# Patient Record
Sex: Female | Born: 1990
Health system: Southern US, Community
[De-identification: ages and names within clinical notes are randomized; demographics above are authoritative.]

## PROBLEM LIST (undated history)

## (undated) DIAGNOSIS — G43909 Migraine, unspecified, not intractable, without status migrainosus: Secondary | ICD-10-CM

## (undated) DIAGNOSIS — Z87448 Personal history of other diseases of urinary system: Secondary | ICD-10-CM

## (undated) HISTORY — DX: Migraine, unspecified, not intractable, without status migrainosus: G43.909

## (undated) HISTORY — PX: UMBILICAL HERNIA REPAIR: SHX196

## (undated) HISTORY — DX: Personal history of other diseases of urinary system: Z87.448

---
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
## 2015-06-03 ENCOUNTER — Other Ambulatory Visit (HOSPITAL_COMMUNITY)
Admission: RE | Admit: 2015-06-03 | Discharge: 2015-06-03 | Disposition: A | Discharge status: Home / Self Care | Payer: BLUE CROSS/BLUE SHIELD | Source: Ambulatory Visit | Attending: Obstetrics and Gynecology | Admitting: Obstetrics and Gynecology

## 2015-06-03 ENCOUNTER — Other Ambulatory Visit: Payer: Self-pay | Admitting: Obstetrics and Gynecology

## 2015-06-03 DIAGNOSIS — Z01419 Encounter for gynecological examination (general) (routine) without abnormal findings: Secondary | ICD-10-CM | POA: Diagnosis present

## 2015-06-03 DIAGNOSIS — Z113 Encounter for screening for infections with a predominantly sexual mode of transmission: Secondary | ICD-10-CM | POA: Insufficient documentation

## 2015-06-07 LAB — CYTOLOGY - PAP

## 2015-09-20 DIAGNOSIS — N3944 Nocturnal enuresis: Secondary | ICD-10-CM | POA: Diagnosis not present

## 2015-09-20 DIAGNOSIS — R350 Frequency of micturition: Secondary | ICD-10-CM | POA: Diagnosis not present

## 2015-09-20 DIAGNOSIS — N3946 Mixed incontinence: Secondary | ICD-10-CM | POA: Diagnosis not present

## 2015-11-05 DIAGNOSIS — M25461 Effusion, right knee: Secondary | ICD-10-CM | POA: Diagnosis not present

## 2015-11-16 DIAGNOSIS — M25561 Pain in right knee: Secondary | ICD-10-CM | POA: Diagnosis not present

## 2015-11-16 DIAGNOSIS — M25461 Effusion, right knee: Secondary | ICD-10-CM | POA: Diagnosis not present

## 2016-01-19 DIAGNOSIS — M25561 Pain in right knee: Secondary | ICD-10-CM | POA: Diagnosis not present

## 2016-01-24 DIAGNOSIS — M25561 Pain in right knee: Secondary | ICD-10-CM | POA: Diagnosis not present

## 2016-05-29 DIAGNOSIS — L309 Dermatitis, unspecified: Secondary | ICD-10-CM | POA: Diagnosis not present

## 2016-06-08 DIAGNOSIS — N3289 Other specified disorders of bladder: Secondary | ICD-10-CM | POA: Diagnosis not present

## 2016-06-08 DIAGNOSIS — F411 Generalized anxiety disorder: Secondary | ICD-10-CM | POA: Diagnosis not present

## 2016-06-08 DIAGNOSIS — F321 Major depressive disorder, single episode, moderate: Secondary | ICD-10-CM | POA: Diagnosis not present

## 2016-06-08 DIAGNOSIS — J4520 Mild intermittent asthma, uncomplicated: Secondary | ICD-10-CM | POA: Diagnosis not present

## 2016-06-28 DIAGNOSIS — F321 Major depressive disorder, single episode, moderate: Secondary | ICD-10-CM | POA: Diagnosis not present

## 2016-06-28 DIAGNOSIS — F411 Generalized anxiety disorder: Secondary | ICD-10-CM | POA: Diagnosis not present

## 2016-07-14 DIAGNOSIS — F321 Major depressive disorder, single episode, moderate: Secondary | ICD-10-CM | POA: Diagnosis not present

## 2016-07-14 DIAGNOSIS — R5383 Other fatigue: Secondary | ICD-10-CM | POA: Diagnosis not present

## 2016-07-14 DIAGNOSIS — F410 Panic disorder [episodic paroxysmal anxiety] without agoraphobia: Secondary | ICD-10-CM | POA: Diagnosis not present

## 2016-07-14 DIAGNOSIS — F411 Generalized anxiety disorder: Secondary | ICD-10-CM | POA: Diagnosis not present

## 2016-10-20 DIAGNOSIS — H16002 Unspecified corneal ulcer, left eye: Secondary | ICD-10-CM | POA: Diagnosis not present

## 2016-10-23 DIAGNOSIS — H16002 Unspecified corneal ulcer, left eye: Secondary | ICD-10-CM | POA: Diagnosis not present

## 2017-05-23 DIAGNOSIS — R350 Frequency of micturition: Secondary | ICD-10-CM | POA: Diagnosis not present

## 2017-05-23 DIAGNOSIS — N3946 Mixed incontinence: Secondary | ICD-10-CM | POA: Diagnosis not present

## 2017-05-25 ENCOUNTER — Encounter (INDEPENDENT_AMBULATORY_CARE_PROVIDER_SITE_OTHER): Payer: Self-pay | Admitting: Orthopaedic Surgery

## 2017-05-25 ENCOUNTER — Ambulatory Visit (INDEPENDENT_AMBULATORY_CARE_PROVIDER_SITE_OTHER): Payer: BLUE CROSS/BLUE SHIELD

## 2017-05-25 ENCOUNTER — Ambulatory Visit (INDEPENDENT_AMBULATORY_CARE_PROVIDER_SITE_OTHER): Payer: BLUE CROSS/BLUE SHIELD | Admitting: Orthopaedic Surgery

## 2017-05-25 VITALS — BP 145/86 | HR 77 | Ht 73.0 in | Wt 260.0 lb

## 2017-05-25 DIAGNOSIS — G8929 Other chronic pain: Secondary | ICD-10-CM | POA: Diagnosis not present

## 2017-05-25 DIAGNOSIS — M2550 Pain in unspecified joint: Secondary | ICD-10-CM | POA: Diagnosis not present

## 2017-05-25 DIAGNOSIS — M545 Low back pain: Secondary | ICD-10-CM

## 2017-05-25 MED ORDER — METHYLPREDNISOLONE 4 MG PO TABS: ORAL_TABLET | ORAL | 0 refills | Status: DC

## 2017-05-25 NOTE — Progress Notes (Signed)
Office Visit Note   Patient: Ashley Valencia           Date of Birth: [birth year]           MRN: [id number] Visit Date: 05/25/2017              Requested by: No referring provider defined for this encounter. PCP: Patient, No Pcp Per   Assessment & Plan: Visit Diagnoses:  1. Chronic bilateral low back pain, with sciatica presence unspecified   2. Polyarthralgia     Plan: With patient's ongoing chronic back problem I will schedule lumbar MRI.  She has failed conservative treatment with prednisone taper, over-the-counter NSAIDs, home exercise program, activity modification.  With patient's ongoing back problem and also bilateral knee pain I had blood work drawn today to check CBC and arthritis panel.  Patient will follow up with Dr. Lorin Mercy in 3 weeks for recheck and to review MRI and blood work.  We will possibly get x-rays bilateral knees at next office visit.  Follow-Up Instructions: Return in about 3 weeks (around 06/15/2017) for With Dr. Lorin Mercy to review lumbar MRI and review labs.   Orders:  Orders Placed This Encounter  Procedures  . XR Lumbar Spine 2-3 Views  . MR Lumbar Spine w/o contrast  . CBC  . Uric acid  . Rheumatoid Factor  . Sed Rate (ESR)  . Antinuclear Antib (ANA)  . HLA-B27 antigen   Meds ordered this encounter  Medications  . methylPREDNISolone (MEDROL) 4 MG tablet    Sig: 6-day taper to be taken as directed    Dispense:  21 tablet    Refill:  0      Procedures: No procedures performed   Clinical Data: No additional findings.   Subjective: Chief Complaint  Patient presents with  . Lower Back - Pain    HPI 27 year old female who is new patient the office comes in with complaints of low back pain and bilateral knee pain.  States that she has had chronic issues with her back for at least 3 years and this has been progressively getting worse the last 6 months.  No specific injury that she can recall.  She was previously seen by Dr. Rip Harbour with  [facility] about 1-2 years ago and was given a prednisone taper and muscle relaxer.  She does not recall any improvement at that time.  She was also given home exercise program that she has done without any relief.  She describes having worsening central low back pain that extends to the left.  No lower extremity radiculopathy.  No complaint of bowel or bladder incontinence.  Her pain is aggravated with laying down, bending, twisting, walking.  Patient helps to take care of her mother who has had an amputation so she is required to do a lot of lifting for transfers.  He also works at [employer] 40 hours/week and does admit to missing some days due to her pain being very bad.  There was some delay in being evaluated due to some insurance issues. Review of Systems No current cardiac pulmonary GI GU issues  Objective: Vital Signs: BP (!) 145/86   Pulse 77   Ht _0  (1.854 m)   Wt 260 lb (117.9 kg)   BMI 34.30 kg/m   Physical Exam  Constitutional: She is oriented to person, place, and time. She appears well-developed. No distress.  HENT:  Head: Normocephalic.  Eyes: EOM are normal.  Pulmonary/Chest: No respiratory distress.  Musculoskeletal:  Exam very pleasant female alert and oriented in no acute distress.  She does have some central low back pain with lumbar extension.  Lumbar flexion hands to ankles without any issues.  She is tender over the L4 and L5 spinous processes.  Nontender over the SI joints.  Mild to moderate left lower lumbar paraspinal tenderness.  Can heel and toe gait without difficulty.  Negative logroll bilateral hips.  Negative straight leg raise.  Bilateral knees good range of motion.  Bilateral knees nontender today.  Cruciate and collateral ligaments are stable.  Negative patellar apprehension.  Not much by the way of patellofemoral crepitus.  Bilateral calves nontender.  Neurovascularly intact.  No focal motor deficits.  Neurological: She is alert and oriented to  person, place, and time.    Ortho Exam  Specialty Comments:  No specialty comments available.  Imaging: Xr Lumbar Spine 2-3 Views  Result Date: 05/25/2017 X-rays lumbar spine does show some straightening of the normal lumbar lordosis.  No acute finding.    PMFS History: There are no active problems to display for this patient.  Past Medical History:  Diagnosis Date  . History of bladder problems   . Migraines     Family History  Problem Relation Age of Onset  . Diabetes Mother   . Hypertension Mother   . Kidney failure Mother   . Diabetes Maternal Grandmother   . Hypertension Maternal Grandmother   . Kidney failure Maternal Grandmother   . Diabetes Maternal Grandfather   . Hypertension Maternal Grandfather     Past Surgical History:  Procedure Laterality Date  . UMBILICAL HERNIA REPAIR     age 47   Social History   Occupational History  . Not on file  Tobacco Use  . Smoking status: Never Smoker  . Smokeless tobacco: Never Used  Substance and Sexual Activity  . Alcohol use: Yes    Comment: rarely  . Drug use: No  . Sexual activity: Not on file

## 2017-05-28 LAB — CBC
HEMATOCRIT: 31.6 % — AB (ref 35.0–45.0)
Hemoglobin: 9.6 g/dL — ABNORMAL LOW (ref 11.7–15.5)
MCH: 20.4 pg — ABNORMAL LOW (ref 27.0–33.0)
MCHC: 30.4 g/dL — ABNORMAL LOW (ref 32.0–36.0)
MCV: 67.1 fL — AB (ref 80.0–100.0)
PLATELETS: 278 10*3/uL (ref 140–400)
RBC: 4.71 10*6/uL (ref 3.80–5.10)
RDW: 14.8 % (ref 11.0–15.0)
WBC: 6.4 10*3/uL (ref 3.8–10.8)

## 2017-05-28 LAB — URIC ACID: Uric Acid, Serum: 4.7 mg/dL (ref 2.5–7.0)

## 2017-05-28 LAB — ANA: ANA: NEGATIVE

## 2017-05-28 LAB — RHEUMATOID FACTOR: Rhuematoid fact SerPl-aCnc: <14 IU/mL (ref <14)

## 2017-05-28 LAB — HLA-B27 ANTIGEN: HLA-B27 Antigen: NEGATIVE

## 2017-05-28 LAB — SEDIMENTATION RATE: Sed Rate: 11 mm/h (ref 0–20)

## 2017-06-05 DIAGNOSIS — F411 Generalized anxiety disorder: Secondary | ICD-10-CM | POA: Diagnosis not present

## 2017-06-05 DIAGNOSIS — Z0000 Encounter for general adult medical examination without abnormal findings: Secondary | ICD-10-CM | POA: Diagnosis not present

## 2017-06-05 DIAGNOSIS — D509 Iron deficiency anemia, unspecified: Secondary | ICD-10-CM | POA: Diagnosis not present

## 2017-06-05 DIAGNOSIS — F331 Major depressive disorder, recurrent, moderate: Secondary | ICD-10-CM | POA: Diagnosis not present

## 2017-06-05 DIAGNOSIS — K219 Gastro-esophageal reflux disease without esophagitis: Secondary | ICD-10-CM | POA: Diagnosis not present

## 2017-06-05 DIAGNOSIS — Z23 Encounter for immunization: Secondary | ICD-10-CM | POA: Diagnosis not present

## 2017-06-12 ENCOUNTER — Ambulatory Visit
Admission: RE | Admit: 2017-06-12 | Discharge: 2017-06-12 | Disposition: A | Discharge status: Home / Self Care | Payer: BLUE CROSS/BLUE SHIELD | Source: Ambulatory Visit | Attending: Surgery | Admitting: Surgery

## 2017-06-12 DIAGNOSIS — M5127 Other intervertebral disc displacement, lumbosacral region: Secondary | ICD-10-CM | POA: Diagnosis not present

## 2017-06-12 DIAGNOSIS — M545 Low back pain: Principal | ICD-10-CM

## 2017-06-12 DIAGNOSIS — G8929 Other chronic pain: Secondary | ICD-10-CM

## 2017-07-03 ENCOUNTER — Encounter (INDEPENDENT_AMBULATORY_CARE_PROVIDER_SITE_OTHER): Payer: Self-pay | Admitting: Orthopaedic Surgery

## 2017-07-03 ENCOUNTER — Ambulatory Visit (INDEPENDENT_AMBULATORY_CARE_PROVIDER_SITE_OTHER): Payer: BLUE CROSS/BLUE SHIELD | Admitting: Orthopaedic Surgery

## 2017-07-03 VITALS — BP 148/84 | HR 73 | Ht 73.0 in | Wt 259.0 lb

## 2017-07-03 DIAGNOSIS — M47816 Spondylosis without myelopathy or radiculopathy, lumbar region: Secondary | ICD-10-CM

## 2017-07-03 NOTE — Progress Notes (Signed)
Office Visit Note   Patient: Ashley Valencia           Date of Birth: 03/31/1990           MRN: [id number] Visit Date: 07/03/2017              Requested by: No referring provider defined for this encounter. PCP: Patient, No Pcp Per   Assessment & Plan: Visit Diagnoses:  1. Lumbar facet arthropathy     Plan: We reviewed the MRI scan I gave her a copy of the report.  She has lumbar facet arthropathy most significant at L4-5.  She has anemia is being further investigated currently she is taking iron.  We discussed the facet injection if her symptoms got so severe she could not work.  She needs to work on weight loss, core strengthening to help support her lumbar spine and degenerative facets.  She has several levels with facet degeneration in the lumbar spine which makes this more difficult problem at her young age of 27.  She may need to look for different work activity that does not bother her back as much in the future.  We discussed weight loss to help unload her back and core strengthening to help.  She can use Tylenol twice a day.  I am hesitant to recommend anti-inflammatories since she has significant anemia.  I plan to recheck her in 3 months.  We will weigh her on return.  Follow-Up Instructions: Return in about 3 months (around 10/02/2017).   Orders:  No orders of the defined types were placed in this encounter.  No orders of the defined types were placed in this encounter.     Procedures: No procedures performed   Clinical Data: No additional findings.   Subjective: Chief Complaint  Patient presents with  . Lower Back - Follow-up    MRI review L spine  . Results    HPI returns with ongoing back symptoms.  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  Rheumatologic lab work was normal.  CBC showed significant anemia with hemoglobin of 9.6.  MCV was decreased as well as MCH and MCHC.  She was placed  on iron by her primary care.  She is here for review of her lumbar MRI scan.  Patient had anti-inflammatories prednisone pack in the past.  Review of Systems 14 point review of systems updated unchanged from 05/25/2017 other than as mentioned in HPI.   Objective: Vital Signs: BP (!) 148/84   Pulse 73   Ht 6\' 1"  (1.854 m)   Wt 259 lb (117.5 kg)   BMI 34.17 kg/m   Physical Exam  Constitutional: She is oriented to person, place, and time. She appears well-developed.  HENT:  Head: Normocephalic.  Right Ear: External ear normal.  Left Ear: External ear normal.  Eyes: Pupils are equal, round, and reactive to light.  Neck: No tracheal deviation present. No thyromegaly present.  Cardiovascular: Normal rate.  Pulmonary/Chest: Effort normal.  Abdominal: Soft.  Neurological: She is alert and oriented to person, place, and time.  Skin: Skin is warm and dry.  Psychiatric: She has a normal mood and affect. Her behavior is normal.    Ortho Exam patient has discomfort with lumbar extension and flexion as well as rotation.  She is able heel and toe walk.  No isolated motor weakness.  Sensation lower extremities is intact no rash over exposed skin.  Knee and hip range of motion is normal.  Specialty Comments:  No specialty comments available.  Imaging: CLINICAL DATA:  Low back pain over the last 5 years worsening recently.  EXAM: MRI LUMBAR SPINE WITHOUT CONTRAST  TECHNIQUE: Multiplanar, multisequence MR imaging of the lumbar spine was performed. No intravenous contrast was administered.  COMPARISON:  None.  FINDINGS: Segmentation:  5 lumbar type vertebral bodies.  Alignment:  Straightening of the normal lumbar lordosis.  Vertebrae: No primary bone lesion. Multiple endplate Schmorl's nodes but without edema.  Conus medullaris and cauda equina: Conus extends to the L1 level. Conus and cauda equina appear normal.  Paraspinal and other soft tissues: Negative  Disc  levels:  No significant finding at L2-3 or above. No bulge or herniation. No stenosis of the canal or foramina.  L3-4: Mild desiccation and bulging of the disc. No stenosis of the canal or foramina.  L4-5: Mild desiccation and bulging of the disc. No stenosis of the canal or foramina. The patient does show facet osteoarthritis at this level with small effusions and mild edema. This could be a cause of low back or referred facet syndrome pain.  L5-S1: Normal appearance of the disc. Mild facet degeneration and hypertrophy. No stenosis of the canal or foramina. Facet arthritis could contribute to low back pain.  IMPRESSION: Mild non-compressive disc bulges at L3-4 and L4-5. There are multiple endplate Schmorl's nodes throughout the lumbar region. These do not show edematous change to suggest that they would be symptomatic  Facet arthritis worse at L4-5 than at L5-S1. Particularly at L4-5 this could be a cause of back pain or referred facet syndrome pain.   Electronically Signed   By: Paulina Fusi M.D.   On: 06/12/2017 16:17    PMFS History: There are no active problems to display for this patient.  Past Medical History:  Diagnosis Date  . History of bladder problems   . Migraines     Family History  Problem Relation Age of Onset  . Diabetes Mother   . Hypertension Mother   . Kidney failure Mother   . Diabetes Maternal Grandmother   . Hypertension Maternal Grandmother   . Kidney failure Maternal Grandmother   . Diabetes Maternal Grandfather   . Hypertension Maternal Grandfather     Past Surgical History:  Procedure Laterality Date  . UMBILICAL HERNIA REPAIR     age 46   Social History   Occupational History  . Not on file  Tobacco Use  . Smoking status: Never Smoker  . Smokeless tobacco: Never Used  Substance and Sexual Activity  . Alcohol use: Yes    Comment: rarely  . Drug use: No  . Sexual activity: Not on file

## 2017-07-31 DIAGNOSIS — M79671 Pain in right foot: Secondary | ICD-10-CM | POA: Diagnosis not present

## 2017-07-31 DIAGNOSIS — D509 Iron deficiency anemia, unspecified: Secondary | ICD-10-CM | POA: Diagnosis not present

## 2017-07-31 DIAGNOSIS — G43101 Migraine with aura, not intractable, with status migrainosus: Secondary | ICD-10-CM | POA: Diagnosis not present

## 2017-09-03 DIAGNOSIS — K219 Gastro-esophageal reflux disease without esophagitis: Secondary | ICD-10-CM | POA: Diagnosis not present

## 2017-09-03 DIAGNOSIS — G43101 Migraine with aura, not intractable, with status migrainosus: Secondary | ICD-10-CM | POA: Diagnosis not present

## 2017-09-03 DIAGNOSIS — D509 Iron deficiency anemia, unspecified: Secondary | ICD-10-CM | POA: Diagnosis not present

## 2017-10-02 ENCOUNTER — Ambulatory Visit (INDEPENDENT_AMBULATORY_CARE_PROVIDER_SITE_OTHER): Payer: BLUE CROSS/BLUE SHIELD | Admitting: Orthopaedic Surgery

## 2018-07-04 DIAGNOSIS — G43101 Migraine with aura, not intractable, with status migrainosus: Secondary | ICD-10-CM | POA: Diagnosis not present

## 2018-07-04 DIAGNOSIS — F411 Generalized anxiety disorder: Secondary | ICD-10-CM | POA: Diagnosis not present

## 2018-07-04 DIAGNOSIS — F331 Major depressive disorder, recurrent, moderate: Secondary | ICD-10-CM | POA: Diagnosis not present

## 2018-07-04 DIAGNOSIS — D509 Iron deficiency anemia, unspecified: Secondary | ICD-10-CM | POA: Diagnosis not present

## 2018-07-04 DIAGNOSIS — N3946 Mixed incontinence: Secondary | ICD-10-CM | POA: Diagnosis not present

## 2018-07-04 DIAGNOSIS — R350 Frequency of micturition: Secondary | ICD-10-CM | POA: Diagnosis not present

## 2018-07-24 ENCOUNTER — Other Ambulatory Visit: Payer: Self-pay

## 2018-07-24 ENCOUNTER — Encounter: Payer: Self-pay | Admitting: Orthopaedic Surgery

## 2018-07-24 ENCOUNTER — Ambulatory Visit: Payer: BLUE CROSS/BLUE SHIELD | Admitting: Orthopaedic Surgery

## 2018-07-24 VITALS — Ht 73.0 in | Wt 245.0 lb

## 2018-07-24 DIAGNOSIS — M47816 Spondylosis without myelopathy or radiculopathy, lumbar region: Secondary | ICD-10-CM | POA: Diagnosis not present

## 2018-07-24 NOTE — Progress Notes (Signed)
Office Visit Note   Patient: Ashley Valencia           Date of Birth: 05/06/1990           MRN: [id number] Visit Date: 07/24/2018              Requested by: No referring provider defined for this encounter. PCP: Patient, No Pcp Per   Assessment & Plan: Visit Diagnoses:  1. Lumbar facet arthropathy     Plan: Patient has maintained disc space height but has minimal disc bulge and significant facet arthropathy in the lumbar spine particularly for her age.  We will have her see Dr. Alvester MorinNewton he can consider epidural injection or facet injection to see if she can get some relief.  Should continue to work on core strengthening getting back in the gym as the gyms reopen after COVID closure.  She will continue to work on weight loss as she is doing.  I plan to recheck her in 8 weeks.  Follow-Up Instructions: No follow-ups on file.   Orders:  Orders Placed This Encounter  Procedures  . Ambulatory referral to Physical Medicine Rehab   No orders of the defined types were placed in this encounter.     Procedures: No procedures performed   Clinical Data: No additional findings.   Subjective: Chief Complaint  Patient presents with  . Lower Back - Pain    HPI 28 year old female returns with ongoing problems with low back pain.  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  She has not gained any weight.  Previous scan showed facet arthropathy at L3-4, L4-5 and L5-S1.  She did have some disc bulge at L3-4 and L4-5 centrally.  She has had bilateral pain in her hips pain sometimes is gone down her right leg but stops at her knee.  She denies numbness or tingling in her feet.  She is taken over-the-counter medication used a heating pad without relief.  Patient is not been on any anti-inflammatories due to past problems with significant anemia.  We had discussed core strengthening and weight loss and she is congratulated in that she has lost 14 pounds.  Her pain is sometimes worse  with sitting.  No fever chills no associated bowel or bladder symptoms.  Review of Systems updated unchanged from 05/16/18 other than as mentioned in HPI   Objective: Vital Signs: Ht 6\' 1"  (1.854 m)   Wt 245 lb (111.1 kg)   BMI 32.32 kg/m   Physical Exam Constitutional:      Appearance: She is well-developed.  HENT:     Head: Normocephalic.     Right Ear: External ear normal.     Left Ear: External ear normal.  Eyes:     Pupils: Pupils are equal, round, and reactive to light.  Neck:     Thyroid: No thyromegaly.     Trachea: No tracheal deviation.  Cardiovascular:     Rate and Rhythm: Normal rate.  Pulmonary:     Effort: Pulmonary effort is normal.  Abdominal:     Palpations: Abdomen is soft.  Skin:    General: Skin is warm and dry.  Neurological:     Mental Status: She is alert and oriented to person, place, and time.  Psychiatric:        Behavior: Behavior normal.     Ortho Exam patient has negative straight leg raising right and left.  No rash over exposed skin negative logroll to the  hips.  She gets from sitting standing can walk on her heels and also on her toes with no weakness.  Mild discomfort in sciatic notch right and left.  She still has discomfort with lumbar extension and also with flexion.  Specialty Comments:  No specialty comments available.  Imaging: CLINICAL DATA:  Low back pain over the last 5 years worsening recently.  EXAM: MRI LUMBAR SPINE WITHOUT CONTRAST  TECHNIQUE: Multiplanar, multisequence MR imaging of the lumbar spine was performed. No intravenous contrast was administered.  COMPARISON:  None.  FINDINGS: Segmentation:  5 lumbar type vertebral bodies.  Alignment:  Straightening of the normal lumbar lordosis.  Vertebrae: No primary bone lesion. Multiple endplate Schmorl's nodes but without edema.  Conus medullaris and cauda equina: Conus extends to the L1 level. Conus and cauda equina appear normal.  Paraspinal and  other soft tissues: Negative  Disc levels:  No significant finding at L2-3 or above. No bulge or herniation. No stenosis of the canal or foramina.  L3-4: Mild desiccation and bulging of the disc. No stenosis of the canal or foramina.  L4-5: Mild desiccation and bulging of the disc. No stenosis of the canal or foramina. The patient does show facet osteoarthritis at this level with small effusions and mild edema. This could be a cause of low back or referred facet syndrome pain.  L5-S1: Normal appearance of the disc. Mild facet degeneration and hypertrophy. No stenosis of the canal or foramina. Facet arthritis could contribute to low back pain.  IMPRESSION: Mild non-compressive disc bulges at L3-4 and L4-5. There are multiple endplate Schmorl's nodes throughout the lumbar region. These do not show edematous change to suggest that they would be symptomatic  Facet arthritis worse at L4-5 than at L5-S1. Particularly at L4-5 this could be a cause of back pain or referred facet syndrome pain.   Electronically Signed   By: Paulina Fusi M.D.   On: 06/12/2017 16:17   PMFS History: Patient Active Problem List   Diagnosis Date Noted  . Lumbar facet arthropathy 07/25/2018   Past Medical History:  Diagnosis Date  . History of bladder problems   . Migraines     Family History  Problem Relation Age of Onset  . Diabetes Mother   . Hypertension Mother   . Kidney failure Mother   . Diabetes Maternal Grandmother   . Hypertension Maternal Grandmother   . Kidney failure Maternal Grandmother   . Diabetes Maternal Grandfather   . Hypertension Maternal Grandfather     Past Surgical History:  Procedure Laterality Date  . UMBILICAL HERNIA REPAIR     age 4   Social History   Occupational History  . Not on file  Tobacco Use  . Smoking status: Never Smoker  . Smokeless tobacco: Never Used  Substance and Sexual Activity  . Alcohol use: Yes    Comment: rarely  . Drug  use: No  . Sexual activity: Not on file

## 2018-07-25 ENCOUNTER — Encounter: Payer: Self-pay | Admitting: Orthopaedic Surgery

## 2018-07-25 DIAGNOSIS — M47816 Spondylosis without myelopathy or radiculopathy, lumbar region: Secondary | ICD-10-CM | POA: Insufficient documentation

## 2018-07-25 NOTE — Progress Notes (Signed)
Pt states a 7/10 for pain.

## 2018-07-29 ENCOUNTER — Telehealth: Payer: Self-pay | Admitting: Radiology

## 2018-07-29 NOTE — Telephone Encounter (Signed)
Received denial from insurance for Lumbar Facet Injection due to note regarding patient's conservative treatment being over 6 months old. Would you like to addend last office note regarding what she has tried so that we can resubmit?

## 2018-07-29 NOTE — Telephone Encounter (Signed)
No much I can do if insurance requires recent conservative treatment before they will approve a facet injection.  Call patient schedule her for some physical therapy and I will recheck her in 1 month and then we can resubmit.  Thank you

## 2018-07-31 NOTE — Telephone Encounter (Signed)
I left voicemail for patient requesting return call to let me know where she would like to go to PT.  I explained the injection had been denied due to no new documentation of PT.  Also left [facility] number in the instance patient would like to reach me there.

## 2018-08-08 ENCOUNTER — Telehealth: Payer: Self-pay | Admitting: Orthopaedic Surgery

## 2018-08-08 MED ORDER — TRAMADOL HCL 50 MG PO TABS: 50.0000 mg | ORAL_TABLET | Freq: Two times a day (BID) | ORAL | 0 refills | Status: DC | PRN

## 2018-08-08 NOTE — Telephone Encounter (Signed)
[facility] called,stated they received rx for Tramadol. Wanted you to be aware patient is also taking amitriptyline and can have interactions. Please callback [phone]

## 2018-08-08 NOTE — Telephone Encounter (Signed)
Ok for ultram 50 mg # 30 1 po bid prn pain thanks [facility]

## 2018-08-08 NOTE — Telephone Encounter (Signed)
Patient has done something to back and is in a lot of pain. Is it possible to get something for pain at least until appt Tuesday? [facility]/[employer]. pts callback [phone]

## 2018-08-08 NOTE — Telephone Encounter (Signed)
Please advise 

## 2018-08-08 NOTE — Telephone Encounter (Signed)
I called med to pharmacy.  I left voicemail for patient advising.  I also explained that I had left voicemail for return call last week in regards to injection being denied by insurance and patient requiring physical therapy prior to insurance approving. I still have not heard from patient regarding PT.

## 2018-08-09 NOTE — Telephone Encounter (Signed)
pls advise. thanks 

## 2018-08-13 ENCOUNTER — Ambulatory Visit: Payer: BLUE CROSS/BLUE SHIELD | Admitting: Orthopaedic Surgery

## 2018-08-13 NOTE — Telephone Encounter (Signed)
I called no answer. Has 9 AM appt . Today.

## 2019-01-28 DIAGNOSIS — K219 Gastro-esophageal reflux disease without esophagitis: Secondary | ICD-10-CM | POA: Diagnosis not present

## 2019-01-28 DIAGNOSIS — G43101 Migraine with aura, not intractable, with status migrainosus: Secondary | ICD-10-CM | POA: Diagnosis not present

## 2019-01-28 DIAGNOSIS — D509 Iron deficiency anemia, unspecified: Secondary | ICD-10-CM | POA: Diagnosis not present

## 2019-01-28 DIAGNOSIS — Z0000 Encounter for general adult medical examination without abnormal findings: Secondary | ICD-10-CM | POA: Diagnosis not present

## 2019-01-28 DIAGNOSIS — F411 Generalized anxiety disorder: Secondary | ICD-10-CM | POA: Diagnosis not present

## 2019-01-28 DIAGNOSIS — Z13220 Encounter for screening for lipoid disorders: Secondary | ICD-10-CM | POA: Diagnosis not present

## 2019-01-28 DIAGNOSIS — F332 Major depressive disorder, recurrent severe without psychotic features: Secondary | ICD-10-CM | POA: Diagnosis not present

## 2019-02-18 DIAGNOSIS — N898 Other specified noninflammatory disorders of vagina: Secondary | ICD-10-CM | POA: Diagnosis not present

## 2019-02-18 DIAGNOSIS — Z01419 Encounter for gynecological examination (general) (routine) without abnormal findings: Secondary | ICD-10-CM | POA: Diagnosis not present

## 2019-02-25 ENCOUNTER — Encounter: Payer: Self-pay | Admitting: Orthopaedic Surgery

## 2019-02-25 ENCOUNTER — Ambulatory Visit (INDEPENDENT_AMBULATORY_CARE_PROVIDER_SITE_OTHER): Payer: BC Managed Care – PPO | Admitting: Orthopaedic Surgery

## 2019-02-25 ENCOUNTER — Other Ambulatory Visit: Payer: Self-pay

## 2019-02-25 VITALS — BP 134/85 | HR 64 | Ht 72.0 in | Wt 216.0 lb

## 2019-02-25 DIAGNOSIS — M47816 Spondylosis without myelopathy or radiculopathy, lumbar region: Secondary | ICD-10-CM
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
## 2019-02-27 DIAGNOSIS — R739 Hyperglycemia, unspecified: Secondary | ICD-10-CM | POA: Diagnosis not present

## 2019-03-05 ENCOUNTER — Encounter: Payer: Self-pay | Admitting: Physical Therapy

## 2019-03-05 ENCOUNTER — Other Ambulatory Visit: Payer: Self-pay

## 2019-03-05 ENCOUNTER — Ambulatory Visit: Payer: BC Managed Care – PPO | Attending: Orthopaedic Surgery | Admitting: Physical Therapy

## 2019-03-05 DIAGNOSIS — M545 Low back pain, unspecified: Secondary | ICD-10-CM

## 2019-03-05 DIAGNOSIS — M62830 Muscle spasm of back: Secondary | ICD-10-CM | POA: Diagnosis not present

## 2019-03-05 DIAGNOSIS — R293 Abnormal posture: Secondary | ICD-10-CM

## 2019-03-05 NOTE — Therapy (Addendum)
[facility] [address] [place], [place], [address] Phone: [phone]   Fax:  [phone]  Physical Therapy Evaluation/Discharge  Patient Details  Name: Ashley Valencia MRN: [id number] Date of Birth: 05-16-1990 Referring Provider (PT): Rodell Perna, MD    Encounter Date: 03/05/2019  PT End of Session - 03/05/19 1205    Visit Number  1    Date for PT Re-Evaluation  04/18/19    Authorization Type  BCBS    Authorization Time Period  03/05/19 to 04/18/19    PT Start Time  1100    PT Stop Time  1144    PT Time Calculation (min)  44 min    Activity Tolerance  Patient tolerated treatment well    Behavior During Therapy  [facility] for tasks assessed/performed       Past Medical History:  Diagnosis Date  . History of bladder problems   . Migraines     Past Surgical History:  Procedure Laterality Date  . UMBILICAL HERNIA REPAIR     age 28    There were no vitals filed for this visit.   Subjective Assessment - 03/05/19 1104    Subjective  Pt states that she has had back pain for several years but in June she had a moment where she had a random shooting pain/numbness down the leg. She currently has pain in the Lt buttock region.    Pertinent History  history of low back pain    Limitations  Sitting;Other (comment)   sleeping on Lt side   How long can you sit comfortably?  pain comes on immediately    Patient Stated Goals  decrease pain    Currently in Pain?  Yes    Pain Score  8     Pain Location  Buttocks    Pain Orientation  Left;Lateral    Pain Descriptors / Indicators  Dull;Nagging    Pain Type  Chronic pain    Pain Radiating Towards  none    Pain Onset  More than a month ago    Aggravating Factors   sitting, sleeping on Lt side, ab machines and back extension machines at the gym    Pain Relieving Factors  standing         [facility] PT Assessment - 03/05/19 0001      Assessment   Medical Diagnosis  facet  arthritis, lumbar region    Referring Provider (PT)  Rodell Perna, MD     Onset Date/Surgical Date  --   June 2020   Next MD Visit  after PT, ~6 weeks    Prior Therapy  none for low back       Precautions   Precautions  None      Restrictions   Weight Bearing Restrictions  No      Balance Screen   Has the patient fallen in the past 6 months  No    Has the patient had a decrease in activity level because of a fear of falling?   No    Is the patient reluctant to leave their home because of a fear of falling?   No      Home Film/video editor residence      Prior Function   Vocation  Full time employment    Vocation Requirements  4-5 days a week (10 hr shifts)- alot of standing     Leisure  goes to gym several days a  week       Cognition   Overall Cognitive Status  Within Functional Limits for tasks assessed      Observation/Other Assessments   Focus on Therapeutic Outcomes (FOTO)   17% limitation      Sensation   Additional Comments  denies numbness/tingling at this time       ROM / Strength   AROM / PROM / Strength  AROM;Strength      AROM   Overall AROM Comments  active lumbar extension in standing and prone pain with return to neutral; flexion WNL, rotation more limited on Lt pain free       Strength   Overall Strength Comments  BLE strength 5/5 MMT and pain free       Palpation   Palpation comment  tenderness along Lt SI joint, Lt lumbar paraspinals, Lt QL, Lt glute max/med      Special Tests   Other special tests  (-) passive straight leg raise; (+) FABER on Lt                Objective measurements completed on examination: See above findings.      [place] [facility] PT Treatment/Exercise - 03/05/19 0001      Self-Care   Self-Care  Other Self-Care Comments    Other Self-Care Comments   adjustments to gym routine: avoiding abdominal crunch machine and resisted back extension machine due to currently causing pain with use        Exercises   Exercises  Other Exercises;Lumbar      Lumbar Exercises: Prone   Opposite Arm/Leg Raise  Right arm/Left leg;Left arm/Right leg    Opposite Arm/Leg Raise Limitations  2 reps HEP demo       Lumbar Exercises: Quadruped   Opposite Arm/Leg Raise  Left arm/Right leg;Right arm/Left leg    Opposite Arm/Leg Raise Limitations  2 reps each; (+) trunk rotation and instability noted with Rt LE/Lt UE reach              PT Education - 03/05/19 1204    Education Details  PT POC/ frequency; implemented and reviewed HEP    Person(s) Educated  Patient    Methods  Explanation;Handout;Verbal cues;Demonstration    Comprehension  Verbalized understanding;Returned demonstration       PT Short Term Goals - 03/05/19 1216      PT SHORT TERM GOAL #1   Title  Pt will demo consistency and independence with initial HEP.    Time  3    Period  Weeks    Status  New    Target Date  03/26/19        PT Long Term Goals - 03/05/19 1216      PT LONG TERM GOAL #1   Title  Pt will be able to complete active lumbar extension without increase in low back pain.    Time  6    Period  Weeks    Status  New    Target Date  04/18/19      PT LONG TERM GOAL #2   Title  Pt will report atleast 50% improvement in low back/buttock pain from the start of PT.    Time  6    Period  Weeks    Status  New      PT LONG TERM GOAL #3   Title  Pt will be able to sit greater than 20 minutes without increase in Lt hip pain.    Time  6  Period  Weeks    Status  New      PT LONG TERM GOAL #4   Title  Pt will have improved trunk stability evident by her ability to complete quadruped LE/UE reach and hold for 5 sec, x10 reps without trunk rotation or hip drop.    Time  6    Period  Weeks    Status  New             Plan - 03/05/19 1209    Clinical Impression Statement  Pt is a pleasant 28 y.o pt referred to OPPT with complaints of chronic Lt sided low back/buttock pain recently exacerbated in June. Pt  has discomfort with sitting, sleeping on her Lt side and with crossing her Lt leg over her knee. Lumbar active ROM is within normal limits, although more limited with Lt trunk rotation than the Rt. Pt had pain with standing and prone lumbar extension with return to neutral only. Pt has palpable tenderness over the Lt SI joint as well as the gluteals and lumbar musculature. Pt's LE strength is full, but there is evident trunk weakness and limitations with stability during active movement. Pt would benefit from skilled PT to address limitations in trunk strength and neuromuscular control, and promote return to daily activity without pain or limitation.    Examination-Activity Limitations  Sit    Stability/Clinical Decision Making  Stable/Uncomplicated    Clinical Decision Making  Low    Rehab Potential  Excellent    PT Frequency  2x / week    PT Duration  6 weeks    PT Treatment/Interventions  ADLs/Self Care Home Management;Cryotherapy;Moist Heat;Neuromuscular re-education;Therapeutic exercise;Therapeutic activities;Patient/family education;Manual techniques;Dry needling;Passive range of motion;Spinal Manipulations;Taping    PT Next Visit Plan  progress trunk stabilization; manual to spine/Lt hip as needed for pain management    PT Home Exercise Plan  [email or web address]    Consulted and Agree with Plan of Care  Patient       Patient will benefit from skilled therapeutic intervention in order to improve the following deficits and impairments:  Improper body mechanics, Pain, Increased muscle spasms, Postural dysfunction, Decreased strength, Decreased range of motion  Visit Diagnosis: Low back pain, unspecified back pain laterality, unspecified chronicity, unspecified whether sciatica present  Abnormal posture  Muscle spasm of back     Problem List Patient Active Problem List   Diagnosis Date Noted  . Lumbar facet arthropathy 07/25/2018    12:21 PM,03/05/19 Sherol Dade PT, DPT [place] [facility] [address] [place], [place], [address] Phone: [phone]   Fax:  [phone]  Name: Ashley Valencia MRN: [id number] Date of Birth: [birth year]   *addendum to resolve episode of care and d/c pt from PT  [place]  Visits from Start of Care: 1  Current functional level related to goals / functional outcomes: See above for more details    Remaining deficits: See above for more details    Education / Equipment: See above for more details   Plan: Patient agrees to discharge.  Patient goals were not met. Patient is being discharged due to not returning since the last visit.  ?????        Pt has not returned since the evaluation. Pt is requesting d/c due to issues with insurance.  10:20 AM,04/01/19 [place], [place] [facility] [place]

## 2019-03-05 NOTE — Patient Instructions (Signed)
Access Code: [email or web address]  URL: [email or web address]  Date: 03/05/2019  Prepared by: Sherol Dade   Exercises  Prone Alternating Arm and Leg Lifts - 15-20 reps - 2 sets - 1x daily - 7x weekly  Supine Lower Trunk Rotation - 10 reps - 2x daily - 7x weekly  Dead Bug - 10 reps - 1x daily - 7x weekly    [facility] [address] [place], [place] [address] Phone # [phone] Fax [phone]

## 2019-03-21 ENCOUNTER — Ambulatory Visit: Payer: BC Managed Care – PPO | Admitting: Physical Therapy

## 2019-03-24 ENCOUNTER — Encounter: Payer: BC Managed Care – PPO | Admitting: Physical Therapy

## 2019-03-31 ENCOUNTER — Encounter: Payer: BC Managed Care – PPO | Admitting: Physical Therapy

## 2019-04-01 ENCOUNTER — Encounter: Payer: BC Managed Care – PPO | Admitting: Physical Therapy

## 2019-04-07 ENCOUNTER — Encounter: Payer: BC Managed Care – PPO | Admitting: Physical Therapy

## 2019-04-16 ENCOUNTER — Encounter: Payer: BC Managed Care – PPO | Admitting: Physical Therapy

## 2019-04-17 ENCOUNTER — Encounter: Payer: BC Managed Care – PPO | Admitting: Physical Therapy

## 2019-05-09 DIAGNOSIS — K1379 Other lesions of oral mucosa: Secondary | ICD-10-CM | POA: Diagnosis not present

## 2019-07-31 DIAGNOSIS — G43101 Migraine with aura, not intractable, with status migrainosus: Secondary | ICD-10-CM | POA: Diagnosis not present

## 2019-07-31 DIAGNOSIS — F411 Generalized anxiety disorder: Secondary | ICD-10-CM | POA: Diagnosis not present

## 2019-07-31 DIAGNOSIS — F3341 Major depressive disorder, recurrent, in partial remission: Secondary | ICD-10-CM | POA: Diagnosis not present

## 2019-08-08 DIAGNOSIS — N3946 Mixed incontinence: Secondary | ICD-10-CM | POA: Diagnosis not present

## 2019-08-08 DIAGNOSIS — R8271 Bacteriuria: Secondary | ICD-10-CM | POA: Diagnosis not present

## 2019-09-11 DIAGNOSIS — N3946 Mixed incontinence: Secondary | ICD-10-CM | POA: Diagnosis not present

## 2019-11-26 ENCOUNTER — Other Ambulatory Visit: Payer: BC Managed Care – PPO

## 2019-11-26 ENCOUNTER — Other Ambulatory Visit: Payer: Self-pay

## 2019-11-26 DIAGNOSIS — Z20822 Contact with and (suspected) exposure to covid-19: Secondary | ICD-10-CM

## 2019-11-28 LAB — SARS-COV-2, NAA 2 DAY TAT

## 2019-11-28 LAB — NOVEL CORONAVIRUS, NAA: SARS-CoV-2, NAA: NOT DETECTED

## 2019-12-31 DIAGNOSIS — I10 Essential (primary) hypertension: Secondary | ICD-10-CM | POA: Diagnosis not present

## 2019-12-31 DIAGNOSIS — F10929 Alcohol use, unspecified with intoxication, unspecified: Secondary | ICD-10-CM | POA: Diagnosis not present

## 2019-12-31 DIAGNOSIS — R457 State of emotional shock and stress, unspecified: Secondary | ICD-10-CM | POA: Diagnosis not present

## 2019-12-31 DIAGNOSIS — F29 Unspecified psychosis not due to a substance or known physiological condition: Secondary | ICD-10-CM | POA: Diagnosis not present

## 2020-01-01 ENCOUNTER — Emergency Department (HOSPITAL_COMMUNITY)
Admission: EM | Admit: 2020-01-01 | Discharge: 2020-01-01 | Disposition: A | Discharge status: Other Acute Inpatient Hospital | Payer: BC Managed Care – PPO | Attending: Emergency Medicine | Admitting: Emergency Medicine

## 2020-01-01 ENCOUNTER — Other Ambulatory Visit: Payer: Self-pay

## 2020-01-01 ENCOUNTER — Inpatient Hospital Stay
Admission: AD | Admit: 2020-01-01 | Discharge: 2020-01-05 | DRG: 885 | Disposition: A | Discharge status: Home / Self Care | Payer: BC Managed Care – PPO | Source: Intra-hospital | Attending: Behavioral Health | Admitting: Behavioral Health

## 2020-01-01 ENCOUNTER — Encounter: Payer: Self-pay | Admitting: Psychiatry

## 2020-01-01 DIAGNOSIS — G43909 Migraine, unspecified, not intractable, without status migrainosus: Secondary | ICD-10-CM | POA: Diagnosis not present

## 2020-01-01 DIAGNOSIS — Z841 Family history of disorders of kidney and ureter: Secondary | ICD-10-CM

## 2020-01-01 DIAGNOSIS — Z833 Family history of diabetes mellitus: Secondary | ICD-10-CM

## 2020-01-01 DIAGNOSIS — Z79899 Other long term (current) drug therapy: Secondary | ICD-10-CM | POA: Diagnosis not present

## 2020-01-01 DIAGNOSIS — F332 Major depressive disorder, recurrent severe without psychotic features: Principal | ICD-10-CM | POA: Diagnosis present

## 2020-01-01 DIAGNOSIS — F1099 Alcohol use, unspecified with unspecified alcohol-induced disorder: Secondary | ICD-10-CM | POA: Diagnosis not present

## 2020-01-01 DIAGNOSIS — Z20822 Contact with and (suspected) exposure to covid-19: Secondary | ICD-10-CM | POA: Diagnosis not present

## 2020-01-01 DIAGNOSIS — F419 Anxiety disorder, unspecified: Secondary | ICD-10-CM | POA: Diagnosis not present

## 2020-01-01 DIAGNOSIS — T424X2A Poisoning by benzodiazepines, intentional self-harm, initial encounter: Secondary | ICD-10-CM | POA: Diagnosis not present

## 2020-01-01 DIAGNOSIS — R9431 Abnormal electrocardiogram [ECG] [EKG]: Secondary | ICD-10-CM | POA: Diagnosis not present

## 2020-01-01 DIAGNOSIS — Z8249 Family history of ischemic heart disease and other diseases of the circulatory system: Secondary | ICD-10-CM

## 2020-01-01 DIAGNOSIS — Z9151 Personal history of suicidal behavior: Secondary | ICD-10-CM

## 2020-01-01 DIAGNOSIS — F322 Major depressive disorder, single episode, severe without psychotic features: Secondary | ICD-10-CM | POA: Diagnosis not present

## 2020-01-01 DIAGNOSIS — T50902A Poisoning by unspecified drugs, medicaments and biological substances, intentional self-harm, initial encounter: Secondary | ICD-10-CM

## 2020-01-01 DIAGNOSIS — Y903 Blood alcohol level of 60-79 mg/100 ml: Secondary | ICD-10-CM | POA: Insufficient documentation

## 2020-01-01 DIAGNOSIS — N3281 Overactive bladder: Secondary | ICD-10-CM | POA: Diagnosis present

## 2020-01-01 LAB — CBC WITH DIFFERENTIAL/PLATELET
Abs Immature Granulocytes: 0.02 10*3/uL (ref 0.00–0.07)
Basophils Absolute: 0 10*3/uL (ref 0.0–0.1)
Basophils Relative: 1 %
Eosinophils Absolute: 0.2 10*3/uL (ref 0.0–0.5)
Eosinophils Relative: 3 %
HCT: 39.7 % (ref 36.0–46.0)
Hemoglobin: 12.9 g/dL (ref 12.0–15.0)
Immature Granulocytes: 0 %
Lymphocytes Relative: 21 %
Lymphs Abs: 1.4 10*3/uL (ref 0.7–4.0)
MCH: 27 pg (ref 26.0–34.0)
MCHC: 32.5 g/dL (ref 30.0–36.0)
MCV: 83.2 fL (ref 80.0–100.0)
Monocytes Absolute: 0.6 10*3/uL (ref 0.1–1.0)
Monocytes Relative: 10 %
Neutro Abs: 4.3 10*3/uL (ref 1.7–7.7)
Neutrophils Relative %: 65 %
Platelets: 163 10*3/uL (ref 150–400)
RBC: 4.77 MIL/uL (ref 3.87–5.11)
RDW: 13.1 % (ref 11.5–15.5)
WBC: 6.6 10*3/uL (ref 4.0–10.5)
nRBC: 0 % (ref 0.0–0.2)

## 2020-01-01 LAB — RAPID URINE DRUG SCREEN, HOSP PERFORMED
Amphetamines: NOT DETECTED
Barbiturates: NOT DETECTED
Benzodiazepines: NOT DETECTED
Cocaine: NOT DETECTED
Opiates: NOT DETECTED
Tetrahydrocannabinol: NOT DETECTED

## 2020-01-01 LAB — COMPREHENSIVE METABOLIC PANEL
ALT: 27 U/L (ref 0–44)
AST: 16 U/L (ref 15–41)
Albumin: 4 g/dL (ref 3.5–5.0)
Alkaline Phosphatase: 37 U/L — ABNORMAL LOW (ref 38–126)
Anion gap: 13 (ref 5–15)
BUN: 9 mg/dL (ref 6–20)
CO2: 21 mmol/L — ABNORMAL LOW (ref 22–32)
Calcium: 9 mg/dL (ref 8.9–10.3)
Chloride: 111 mmol/L (ref 98–111)
Creatinine, Ser: 0.63 mg/dL (ref 0.44–1.00)
GFR, Estimated: >60 mL/min (ref >60)
Glucose, Bld: 105 mg/dL — ABNORMAL HIGH (ref 70–99)
Potassium: 3.2 mmol/L — ABNORMAL LOW (ref 3.5–5.1)
Sodium: 145 mmol/L (ref 135–145)
Total Bilirubin: 0.8 mg/dL (ref 0.3–1.2)
Total Protein: 6.8 g/dL (ref 6.5–8.1)

## 2020-01-01 LAB — SALICYLATE LEVEL: Salicylate Lvl: <7 mg/dL — ABNORMAL LOW (ref 7.0–30.0)

## 2020-01-01 LAB — RESPIRATORY PANEL BY RT PCR (FLU A&B, COVID)
Influenza A by PCR: NEGATIVE
Influenza B by PCR: NEGATIVE
SARS Coronavirus 2 by RT PCR: NEGATIVE

## 2020-01-01 LAB — ACETAMINOPHEN LEVEL: Acetaminophen (Tylenol), Serum: <10 ug/mL — ABNORMAL LOW (ref 10–30)

## 2020-01-01 LAB — I-STAT BETA HCG BLOOD, ED (MC, WL, AP ONLY): I-stat hCG, quantitative: <5 m[IU]/mL (ref <5)

## 2020-01-01 LAB — ETHANOL: Alcohol, Ethyl (B): 62 mg/dL — ABNORMAL HIGH (ref <10)

## 2020-01-01 MED ORDER — HYDROXYZINE HCL 50 MG PO TABS
50.0000 mg | ORAL_TABLET | Freq: Three times a day (TID) | ORAL | Status: DC | PRN
  Administered 2020-01-01 – 2020-01-03 (×3): 50 mg via ORAL
  Filled 2020-01-01 (×3): qty 1

## 2020-01-01 MED ORDER — MAGNESIUM HYDROXIDE 400 MG/5ML PO SUSP: 30.0000 mL | Freq: Every day | ORAL | Status: DC | PRN

## 2020-01-01 MED ORDER — TRAZODONE HCL 100 MG PO TABS
100.0000 mg | ORAL_TABLET | Freq: Every evening | ORAL | Status: DC | PRN
  Administered 2020-01-01 – 2020-01-04 (×4): 100 mg via ORAL
  Filled 2020-01-01 (×5): qty 1

## 2020-01-01 MED ORDER — ACETAMINOPHEN 325 MG PO TABS
650.0000 mg | ORAL_TABLET | Freq: Four times a day (QID) | ORAL | Status: DC | PRN
  Administered 2020-01-02: 650 mg via ORAL
  Filled 2020-01-01: qty 2

## 2020-01-01 MED ORDER — ALUM & MAG HYDROXIDE-SIMETH 200-200-20 MG/5ML PO SUSP: 30.0000 mL | ORAL | Status: DC | PRN

## 2020-01-01 NOTE — Consult Note (Signed)
  Chart reviewed.  Tentative acceptance to [facility] pending bed availability and nursing approval

## 2020-01-01 NOTE — ED Notes (Addendum)
Pt. In burgundy scrubs. Pt. wanded by security.pt. has 2 belongings bags.pt.  Has 1 pr black shoes, 1 pr. Black socks, 1 blue/pink bra, 1 orange t-shirt, 1 [place] short(red/gray) and 1 cell phone and 1 white charger and 1 brown wallet(5-$10 dollar bills and $1 dollar bill), credit card, debit card and drivers license. Pt. Belongings locked up at the nurses station between 16-18. Pt. Wallet with money and credit cards and debit card and drivers license locked up in security office. Pt. Locked key is in pt. IVC papers.

## 2020-01-01 NOTE — Progress Notes (Signed)
Admission Note:   Report was received from [place], [place] on a 29 year old female who presents IVC in no acute distress for the treatment of SI and Depression. Patient stated that she was drinking last night and acted on an impulse by overdosing on Klonopin. Patient was tearful while talking with this Clinical research associate, however, she was calm and cooperative. Patient endorsed depression and anxiety, stating that she is "clinically depressed, so I'm depressed all the time". Patient also stated that her anxiety is a little high because of being in a new place. Patient stated that her stressors are that this will be her second divorce and that work is a stressor for her. Patient has a past medical history of Headaches. Patient stated that her goals for treatment is to "feel better". Skin was assessed with Gigi, RN and found to be clear of any abnormal marks apart from multiple tattoos over her body. Patient searched and no contraband found and unit policies explained and understanding verbalized. Consents obtained. Food and fluids offered and accepted. Patient had no additional questions or concerns to voice at this time.

## 2020-01-01 NOTE — ED Notes (Signed)
Called report to TCU. Will send patient over after TTS

## 2020-01-01 NOTE — ED Notes (Signed)
TTS machine placed in room. 

## 2020-01-01 NOTE — BH Assessment (Signed)
Patient has been accepted to [facility].  Accepting physician is Dr. Toni Amend.  Attending  Physician will be Dr. Neale Burly.  Patient has been assigned to room 319, by [facility] Charge Nurse Gigi.   Call report to [phone].  Representative/Transfer Coordinator is Warden/ranger Patient pre-admitted by [facility] Patient Access [facility]).

## 2020-01-01 NOTE — ED Triage Notes (Signed)
Patient BIB EMS for intentional overdose of sixteen 0.5mg  Klonopin, patient states her wife left her and she wants to speak to someone in mental health, is currently voluntary at this time. Patient is AxOx4, calm and cooperative, ambulatory without assistance. VSS.

## 2020-01-01 NOTE — ED Provider Notes (Signed)
[facility]-EMERGENCY DEPT Provider Note   CSN: [id number] Arrival date & time: 01/01/20  0004     History Chief Complaint  Patient presents with  . Drug Overdose    Ashley Valencia is a 29 y.o. female.  The history is provided by the patient and medical records.  Drug Overdose    29 year old female with history of migraine headaches, presenting to the ED after intentional overdose.  She took 16 tablets of 0.5 mg Klonopin in attempt to hurt herself.  States her wife recently left her and she has not been in a good mental state.  Denies any other coingestions.  No prior psychiatric history. She does want help and is willing to speak with someone from psychiatric service.  Past Medical History:  Diagnosis Date  . History of bladder problems   . Migraines     Patient Active Problem List   Diagnosis Date Noted  . Lumbar facet arthropathy 07/25/2018    Past Surgical History:  Procedure Laterality Date  . UMBILICAL HERNIA REPAIR     age 71     OB History   No obstetric history on file.     Family History  Problem Relation Age of Onset  . Diabetes Mother   . Hypertension Mother   . Kidney failure Mother   . Diabetes Maternal Grandmother   . Hypertension Maternal Grandmother   . Kidney failure Maternal Grandmother   . Diabetes Maternal Grandfather   . Hypertension Maternal Grandfather     Social History   Tobacco Use  . Smoking status: Never Smoker  . Smokeless tobacco: Never Used  Substance Use Topics  . Alcohol use: Yes    Comment: rarely  . Drug use: No    Home Medications Prior to Admission medications   Medication Sig Start Date End Date Taking? Authorizing Provider  amitriptyline (ELAVIL) 50 MG tablet  07/04/18   [provider]  Butalbital-APAP-Caffeine 50-300-40 MG CAPS  07/04/18   [provider]  clonazePAM (KLONOPIN) 0.5 MG tablet TAKE 1 TABLET BY MOUTH TWICE A DAY AS NEEDED FOR 5 DAYS 06/05/17   [provider]  methylPREDNISolone (MEDROL) 4 MG tablet 6-day taper to be taken as directed Patient not taking: Reported on 07/03/2017 05/25/17   Naida Sleight, PA-C  pantoprazole (PROTONIX) 40 MG tablet Take 40 mg by mouth daily. 06/06/17   [provider]  propranolol ER (INDERAL LA) 120 MG 24 hr capsule  07/04/18   [provider]  SUMAtriptan (IMITREX) 50 MG tablet  07/04/18   [provider]  tolterodine (DETROL LA) 4 MG 24 hr capsule Take 4 mg by mouth daily. 06/22/17   [provider]  traMADol (ULTRAM) 50 MG tablet Take 1 tablet (50 mg total) by mouth 2 (two) times daily as needed. 08/08/18   Eldred Manges, MD  venlafaxine (EFFEXOR) 37.5 MG tablet Take 37.5 mg by mouth 2 (two) times daily.    [provider]    Allergies    Patient has no known allergies.  Review of Systems   Review of Systems  Psychiatric/Behavioral: Positive for suicidal ideas.  All other systems reviewed and are negative.   Physical Exam Updated Vital Signs BP (!) 148/89 (BP Location: Left Arm)   Pulse (!) 105   Temp 98 F (36.7 C) (Oral)   Resp 18   Ht 6' (1.829 m)   Wt 98 kg   SpO2 99%   BMI 29.30 kg/m  Physical Exam Vitals and nursing note reviewed.  Constitutional:      Appearance: She is well-developed.     Comments: Drowsy but arousable to voice  HENT:     Head: Normocephalic and atraumatic.  Eyes:     Conjunctiva/sclera: Conjunctivae normal.     Pupils: Pupils are equal, round, and reactive to light.  Cardiovascular:     Rate and Rhythm: Normal rate and regular rhythm.     Heart sounds: Normal heart sounds.  Pulmonary:     Effort: Pulmonary effort is normal.     Breath sounds: Normal breath sounds.  Abdominal:     General: Bowel sounds are normal.     Palpations: Abdomen is soft.  Musculoskeletal:        General: Normal range of motion.     Cervical back: Normal range of motion.  Skin:    General: Skin is warm and dry.  Neurological:      Mental Status: She is alert and oriented to person, place, and time.  Psychiatric:        Thought Content: Thought content includes suicidal ideation. Thought content includes suicidal plan.     ED Results / Procedures / Treatments   Labs (all labs ordered are listed, but only abnormal results are displayed) Labs Reviewed  ETHANOL - Abnormal; Notable for the following components:      Result Value   Alcohol, Ethyl (B) 62 (*)    All other components within normal limits  SALICYLATE LEVEL - Abnormal; Notable for the following components:   Salicylate Lvl <7.0 (*)    All other components within normal limits  ACETAMINOPHEN LEVEL - Abnormal; Notable for the following components:   Acetaminophen (Tylenol), Serum <10 (*)    All other components within normal limits  COMPREHENSIVE METABOLIC PANEL - Abnormal; Notable for the following components:   Potassium 3.2 (*)    CO2 21 (*)    Glucose, Bld 105 (*)    Alkaline Phosphatase 37 (*)    All other components within normal limits  RESPIRATORY PANEL BY RT PCR (FLU A&B, COVID)  CBC WITH DIFFERENTIAL/PLATELET  RAPID URINE DRUG SCREEN, HOSP PERFORMED  I-STAT BETA HCG BLOOD, ED ([facility], [facility], [facility] ONLY)    EKG None   ED ECG REPORT   Date: 01/01/2020  Rate: 86  Rhythm: normal sinus  QRS Axis: normal  Intervals: normal  ST/T Wave abnormalities: normal  Conduction Disutrbances:none  Narrative Interpretation:   Old EKG Reviewed: none available  I have personally reviewed the EKG tracing and agree with the computerized printout as noted.   Radiology No results found.  Procedures Procedures (including critical care time)  Medications Ordered in ED Medications - No data to display  ED Course  I have reviewed the triage vital signs and the nursing notes.  Pertinent labs & imaging results that were available during my care of the patient were reviewed by me and considered in my medical decision making (see chart for details).     MDM Rules/Calculators/A&P    29 year old female presenting to the ED after intentional overdose.  She took 16 tablets of 0.5 mg Klonopin tonight and attempt to hurt herself.  States she recently separated from her wife and is having issues dealing with this.  On arrival she is drowsy but arousable to voice.  She is able to answer some questions but falls back asleep.  Will need to metabolize.  Labs pending.  Will reassess.  Labs as above-- ethanol 62.  UDS actually negative along with APAP and salicylate levels.  RT panel negative.  Will continue to observe until more awake/alert.  5:24 AM Patient has been observed here for a few hours and is now fully awake and alert.  She is ambulatory to the bathroom unassisted.  Continues to desire mental health evaluation which I think is appropriate.  Patient is medically cleared at this time.  Will get TTS consult.  Care will be signed out to oncoming APP to follow-up on TTS recommendations.  Patient remains voluntary at this time.  Final Clinical Impression(s) / ED Diagnoses Final diagnoses:  Intentional drug overdose, initial encounter [facility])    Rx / DC Orders ED Discharge Orders    None       Garlon Hatchet, PA-C 01/01/20 0612    Molpus, Jonny Ruiz, MD 01/01/20 [id number]

## 2020-01-01 NOTE — ED Notes (Signed)
[facility] called for transportation to [facility]

## 2020-01-01 NOTE — BH Assessment (Signed)
Assessment Note  Ashley Valencia is an 29 y.o. female. She presents to [facility] via EMS, voluntarily. States that she overdosed on Klonipin last night approximately 10pm. She called and told her sister after the overdose. The sister then called patient's wife who called 911/EMS. Patient states that she consumed #16 of her prescribed klonopin intentionally. Her purpose in overdosing was to "Never wake up again". The suicide attempt was triggered by "#2 failed marriages". States that her current spouse recently left her and she has sense felt depressed. She reports the following depressive symptoms: Feeling angry/irritable, Feeling worthless/self pity, Loss of interest in usual pleasures, Guilt, Fatigue, Isolating, Tearfulness, Insomnia, Despondent. She also has increased anxiety recently due to stress. Also, panic attacks up to 2x's per week. She denies prior suicide attempts and/or gestures. No current or recent self mutilating behaviors. However, would cut herself when she was in middle school.  No access to firearms. Her only access to means for self harm are prescription pills. She has a family history of mental illness (brother-"Bipolar Schizophrenia"). No history of abuse-sexual, physical, emotional, etc. She lives in a household with her spouse and #2 children. She considers her family to be her support system. She is currently employed.   Patient denies HI. No history of aggressive and/assaultive behaviors. No legal issue. She is not on probation. Nor has any court dates. She denies AVH's and does not appear to be responding to internal stimuli. She denies drug use. However, drinks alcohol 3x's per week, #3 cups of liquor per day. Last drink was last night she reports drinking #3 drinks at a [employer]. She reports no history of inpatient treatment. No history of outpatient psychiatric treatment (no psychiatrist/no therapist).   Patient is currently alert and oriented x4. She is calm and cooperative.  Insight and judgement are fair. Her impulse control is poor. Memory is recent and remotely intact. Speech is normal. Affect is sad and depressed. Her mood is congruent to her affect.      Diagnosis: Major Depressive Disorder, Single Episode, Severe without psychotic features, Anxiety Disorder, Alcohol Use Disorder   Past Medical History:  Past Medical History:  Diagnosis Date  . History of bladder problems   . Migraines     Past Surgical History:  Procedure Laterality Date  . UMBILICAL HERNIA REPAIR     age 87    Family History:  Family History  Problem Relation Age of Onset  . Diabetes Mother   . Hypertension Mother   . Kidney failure Mother   . Diabetes Maternal Grandmother   . Hypertension Maternal Grandmother   . Kidney failure Maternal Grandmother   . Diabetes Maternal Grandfather   . Hypertension Maternal Grandfather     Social History:  reports that she has never smoked. She has never used smokeless tobacco. She reports current alcohol use. She reports that she does not use drugs.  Additional Social History:  Alcohol / Drug Use Pain Medications: SEE MAR Prescriptions: SEE MAR Over the Counter: SEE MAR History of alcohol / drug use?: Yes Longest period of sobriety (when/how long): 1 week Substance #1 Name of Substance 1: Alcohol 1 - Age of First Use: 29 yrs old 1 - Amount (size/oz): "Maybe 3 cups 1 - Frequency: 3x's per week 1 - Duration: on-going 1 - Last Use / Amount: 10pm last night (01/01/20); #3 drinks from [employer]  CIWA: CIWA-Ar BP: 130/88 Pulse Rate: 80 COWS:    Allergies: No Known Allergies  Home Medications: (  Not in a hospital admission)   OB/GYN Status:  No LMP recorded.  General Assessment Data Location of Assessment: [facility] ED TTS Assessment: In system Is this a Tele or Face-to-Face Assessment?: Tele Assessment Is this an Initial Assessment or a Re-assessment for this encounter?: Initial Assessment Patient Accompanied by::   (EMS) Language Other than English: No Living Arrangements:  (wife and 2 kidds ) What gender do you identify as?: Female Date Telepsych consult ordered in [facility]:  (01/01/2020) Marital status: Married Artist name:  Katrinka Blazing ) Pregnancy Status: No Living Arrangements:  (Wife and #2 kids ) Can pt return to current living arrangement?: Yes Admission Status: Voluntary Is patient capable of signing voluntary admission?: Yes Referral Source: Self/Family/Friend Insurance type:  Herbalist)  Medical Screening Exam [facility] Walk-in ONLY) Medical Exam completed: No  Crisis Care Plan Living Arrangements:  (Wife and #2 kids ) Legal Guardian:  (n/a) Name of Psychiatrist:  (no psychiatrist ) Name of Therapist:  (no therapist)  Education Status Is patient currently in school?: No ("less than High School") Current Grade:  (n/a) Highest grade of school patient has completed:  (some High School ) Name of school:  (n/a) Contact person:  (n/a) IEP information if applicable:  (no) Is the patient employed, unemployed or receiving disability?: Employed  Risk to self with the past 6 months Suicidal Ideation: Yes-Currently Present (onset last night ) Has patient been a risk to self within the past 6 months prior to admission? : Yes Suicidal Intent: Yes-Currently Present Has patient had any suicidal intent within the past 6 months prior to admission? : Yes Is patient at risk for suicide?: Yes Suicidal Plan?: Yes-Currently Present Has patient had any suicidal plan within the past 6 months prior to admission? : Yes Specify Current Suicidal Plan:  (Overdosed on klonopin #16 ) Access to Means: Yes (RX pills ) Specify Access to Suicidal Means:  (prescription medications; no firearms ) What has been your use of drugs/alcohol within the last 12 months?:  ("I just quit drinking but recently started back") Previous Attempts/Gestures: No How many times?:  (0) Other Self Harm Risks:  ("When I was in middle school I would  cut myself") Triggers for Past Attempts:  (no past attempts ) Intentional Self Injurious Behavior: Cutting (hx of cutting in middle school ) Comment - Self Injurious Behavior:  (hx of cutting in middle school ) Family Suicide History: Yes ("My dad was Bipolar Schizophrenia") Recent stressful life event(s):  ("failed marriage #2" and "work stress") Depression: Yes Depression Symptoms: Feeling angry/irritable, Feeling worthless/self pity, Loss of interest in usual pleasures, Guilt, Fatigue, Isolating, Tearfulness, Insomnia, Despondent Substance abuse history and/or treatment for substance abuse?: No Suicide prevention information given to non-admitted patients: Not applicable  Risk to Others within the past 6 months Homicidal Ideation: No Does patient have any lifetime risk of violence toward others beyond the six months prior to admission? : No Thoughts of Harm to Others: No Current Homicidal Intent: No Current Homicidal Plan: No Access to Homicidal Means: No Identified Victim:  (n/a) History of harm to others?: No Assessment of Violence: None Noted Violent Behavior Description:  (patient is calm and cooperative ) Does patient have access to weapons?: No Criminal Charges Pending?: No Does patient have a court date: No Is patient on probation?: No  Psychosis Hallucinations: None noted Delusions: None noted  Mental Status Report Appearance/Hygiene: In scrubs Eye Contact: Fair Motor Activity: Freedom of movement Speech: Logical/coherent Level of Consciousness: Alert Mood: Depressed, Sad Affect: Depressed, Sad  Anxiety Level: Panic Attacks Panic attack frequency:  ("Recently .Marland KitchenMarland KitchenMarland Kitchen2 per week") Most recent panic attack:  (12/31/19) Thought Processes: Relevant Judgement: Impaired Orientation: Person, Place, Time, Situation Obsessive Compulsive Thoughts/Behaviors: None  Cognitive Functioning Concentration: Decreased Memory: Recent Intact, Remote Intact Is patient IDD:  No Insight: Fair Impulse Control: Poor Appetite: Poor Have you had any weight changes? : No Change Sleep:  (12-13 hrs ) Total Hours of Sleep:  (13 hrs ) Vegetative Symptoms: None  ADLScreening [facility] Assessment Services) Patient's cognitive ability adequate to safely complete daily activities?: Yes Patient able to express need for assistance with ADLs?: Yes Independently performs ADLs?: Yes (appropriate for developmental age)  Prior Inpatient Therapy Prior Inpatient Therapy: No  Prior Outpatient Therapy Prior Outpatient Therapy: No Does patient have an ACCT team?: No Does patient have Intensive In-House Services?  : No Does patient have [facility] services? : No Does patient have P4CC services?: No  ADL Screening (condition at time of admission) Patient's cognitive ability adequate to safely complete daily activities?: Yes Patient able to express need for assistance with ADLs?: Yes Independently performs ADLs?: Yes (appropriate for developmental age)       Abuse/Neglect Assessment (Assessment to be complete while patient is alone) Abuse/Neglect Assessment Can Be Completed: Yes Physical Abuse: Denies Verbal Abuse: Denies Sexual Abuse: Denies Exploitation of patient/patient's resources: Denies Self-Neglect: Denies                Disposition: Per Berneice Heinrich, NP, patient meets inpatient treatment. Disposition LCSW to seek appropriate placement.  Disposition Initial Assessment Completed for this Encounter: Yes Patient referred to:  (Pending Disposition Social Worker placement )  On Site Evaluation by:   Reviewed with Physician:    Melynda Ripple 01/01/2020 8:41 AM

## 2020-01-01 NOTE — ED Notes (Signed)
IVC paperwork faxed to counselor at [facility].

## 2020-01-01 NOTE — Tx Team (Signed)
Initial Treatment Plan 01/01/2020 6:46 PM [name] TWK:[id number]    PATIENT STRESSORS: Marital or family conflict Other: Work issues   PATIENT STRENGTHS: Wellsite geologist fund of knowledge Supportive family/friends Work skills   PATIENT IDENTIFIED PROBLEMS: SI by OD  Depression  Anxiety                 DISCHARGE CRITERIA:  Ability to meet basic life and health needs Improved stabilization in mood, thinking, and/or behavior Need for constant or close observation no longer present Reduction of life-threatening or endangering symptoms to within safe limits  PRELIMINARY DISCHARGE PLAN: Outpatient therapy Return to previous living arrangement Return to previous work or school arrangements  PATIENT/FAMILY INVOLVEMENT: This treatment plan has been presented to and reviewed with the patient, Ashley Valencia. The patient has been given the opportunity to ask questions and make suggestions.  Cambren Helm, RN 01/01/2020, 6:46 PM

## 2020-01-01 NOTE — Plan of Care (Signed)
New admission.  Problem: Education: Goal: Knowledge of [facility] Education information/materials will improve Outcome: Not Progressing Goal: Emotional status will improve Outcome: Not Progressing Goal: Mental status will improve Outcome: Not Progressing Goal: Verbalization of understanding the information provided will improve Outcome: Not Progressing   Problem: Safety: Goal: Periods of time without injury will increase Outcome: Not Progressing   Problem: Coping: Goal: Coping ability will improve Outcome: Not Progressing Goal: Will verbalize feelings Outcome: Not Progressing   Problem: Self-Concept: Goal: Level of anxiety will decrease Outcome: Not Progressing   Problem: Coping: Goal: Coping ability will improve Outcome: Not Progressing   Problem: Self-Concept: Goal: Ability to disclose and discuss suicidal ideas will improve Outcome: Not Progressing

## 2020-01-02 DIAGNOSIS — F332 Major depressive disorder, recurrent severe without psychotic features: Principal | ICD-10-CM

## 2020-01-02 MED ORDER — DARIFENACIN HYDROBROMIDE ER 7.5 MG PO TB24
15.0000 mg | ORAL_TABLET | Freq: Every day | ORAL | Status: DC
  Administered 2020-01-02 – 2020-01-05 (×4): 15 mg via ORAL
  Filled 2020-01-02: qty 2
  Filled 2020-01-02: qty 1
  Filled 2020-01-02 (×3): qty 2

## 2020-01-02 MED ORDER — FLUOXETINE HCL 20 MG PO CAPS
20.0000 mg | ORAL_CAPSULE | Freq: Every day | ORAL | Status: DC
  Administered 2020-01-02 – 2020-01-05 (×4): 20 mg via ORAL
  Filled 2020-01-02 (×4): qty 1

## 2020-01-02 MED ORDER — ARIPIPRAZOLE 10 MG PO TABS
10.0000 mg | ORAL_TABLET | Freq: Every day | ORAL | Status: DC
  Administered 2020-01-02 – 2020-01-05 (×4): 10 mg via ORAL
  Filled 2020-01-02 (×4): qty 1

## 2020-01-02 MED ORDER — PROPRANOLOL HCL 20 MG PO TABS
40.0000 mg | ORAL_TABLET | Freq: Every day | ORAL | Status: DC
  Administered 2020-01-02 – 2020-01-05 (×4): 40 mg via ORAL
  Filled 2020-01-02 (×4): qty 2

## 2020-01-02 NOTE — Progress Notes (Signed)
Patient alert and oriented x 4, affect is blunted, thoughts are organized and coherent, she isolates to her room, minimal conversation with staff, she denies SI/HI/AVH complaint with medication regimen , 15 minutes safety checks maintained will continue to monitor.

## 2020-01-02 NOTE — [facility] Suicide Risk Assessment (Signed)
Ashley Valencia Admission Suicide Risk Assessment   Nursing information obtained from:  Patient Demographic factors:  Gay, lesbian, or bisexual orientation Current Mental Status:  NA Loss Factors:  Loss of significant relationship Historical Factors:  Prior suicide attempts (prior suicide attempts, per report, but pt. denies this.) Risk Reduction Factors:  Employed  Total Time spent with patient: 1 hour Principal Problem: Severe recurrent major depression without psychotic features (HCC) Diagnosis:  Principal Problem:   Severe recurrent major depression without psychotic features (HCC)  Subjective Data: Ashley Valencia was seen in treatment team and one-on-one. She states that she was feeling increasingly depressed after her second marriage had failed. She states she impulsively took Klonopin in a suicide attempt. She was relieved to wake up from this overdose, and regrets the attempt. She states she never wants to attempt suicide again. She has been on Abilify 10 mg daily for about a year. She feels it was initially helpful, but feels it wore off. She has been on Zoloft and Amitriptyline in the past without good effect. She notes that she continues to experience depression with low energy, over sleeping, anhedonia, depressed mood, and poor appetite. She also reports anxiety particularly when around new people. 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 She has a friend that works with her that is her main source of support. She feels she would benefit from change in medication at this time. Discussed risks, benefits, and alternatives of Prozac and Remeron, and patient opts to try Prozac at this time. She also is interested in having an outpatient psychiatrist at discharge. Will convert to voluntary admission at this time.   Continued Clinical Symptoms:  Alcohol Use Disorder Identification Test Final Score (AUDIT): 3 The "Alcohol Use Disorders  Identification Test", Guidelines for Use in Primary Care, Second Edition.  World Science writer [place]). Score between 0-7:  no or low risk or alcohol related problems. Score between 8-15:  moderate risk of alcohol related problems. Score between 16-19:  high risk of alcohol related problems. Score 20 or above:  warrants further diagnostic evaluation for alcohol dependence and treatment.   CLINICAL FACTORS:   Severe Anxiety and/or Agitation Depression:   Impulsivity Severe Previous Psychiatric Diagnoses and Treatments   Musculoskeletal: Strength & Muscle Tone: within normal limits Gait & Station: normal Patient leans: N/A  Psychiatric Specialty Exam: Physical Exam Vitals and nursing note reviewed.  Constitutional:      Appearance: Normal appearance.  HENT:     Head: Normocephalic and atraumatic.     Right Ear: External ear normal.     Left Ear: External ear normal.     Nose: Nose normal.     Mouth/Throat:     Mouth: Mucous membranes are moist.     Pharynx: Oropharynx is clear.  Eyes:     Extraocular Movements: Extraocular movements intact.     Conjunctiva/sclera: Conjunctivae normal.     Pupils: Pupils are equal, round, and reactive to light.  Cardiovascular:     Rate and Rhythm: Normal rate.     Pulses: Normal pulses.  Pulmonary:     Effort: Pulmonary effort is normal.     Breath sounds: Normal breath sounds.  Abdominal:     General: Abdomen is flat.     Palpations: Abdomen is soft.  Musculoskeletal:        General: No swelling. Normal range of motion.     Cervical back: Normal range of motion and  neck supple.  Skin:    General: Skin is warm and dry.  Neurological:     General: No focal deficit present.     Mental Status: She is alert and oriented to person, place, and time.  Psychiatric:        Attention and Perception: She does not perceive auditory or visual hallucinations.        Mood and Affect: Mood is depressed. Affect is blunt.        Speech: Speech  normal.        Behavior: Behavior normal.        Thought Content: Thought content includes suicidal ideation.        Cognition and Memory: Cognition and memory normal.        Judgment: Judgment is impulsive.     Review of Systems  Constitutional: Positive for appetite change and fatigue.  HENT: Negative for rhinorrhea and sore throat.   Eyes: Negative for photophobia and visual disturbance.  Respiratory: Negative for cough and shortness of breath.   Cardiovascular: Negative for chest pain and palpitations.  Gastrointestinal: Negative for constipation, diarrhea, nausea and vomiting.  Endocrine: Negative for cold intolerance and heat intolerance.  Genitourinary: Negative for difficulty urinating and dysuria.  Musculoskeletal: Negative for arthralgias and myalgias.  Skin: Negative for rash and wound.  Allergic/Immunologic: Negative for environmental allergies and food allergies.  Neurological: Negative for dizziness and headaches.  Hematological: Negative for adenopathy. Does not bruise/bleed easily.  Psychiatric/Behavioral: Positive for dysphoric mood, sleep disturbance and suicidal ideas. The patient is nervous/anxious.     Blood pressure (!) 146/95, pulse 85, temperature 97.8 F (36.6 C), temperature source Oral, resp. rate 16, height 6' (1.829 m), weight 107 kg, SpO2 100 %.Body mass index is 32.01 kg/m.  General Appearance: Fairly Groomed  Eye Contact:  Fair  Speech:  Normal Rate  Volume:  Normal  Mood:  Anxious and Depressed  Affect:  Congruent  Thought Process:  Coherent  Orientation:  Full (Time, Place, and Person)  Thought Content:  Logical  Suicidal Thoughts:  Yes.  without intent/plan  Homicidal Thoughts:  No  Memory:  Immediate;   Fair Recent;   Fair Remote;   Fair  Judgement:  Intact  Insight:  Fair  Psychomotor Activity:  Normal  Concentration:  Concentration: Good and Attention Span: Good  Recall:  Good  Fund of Knowledge:  Good  Language:  Good  Akathisia:   Negative  Handed:  Right  AIMS (if indicated):     Assets:  Communication Skills Desire for Improvement Financial Resources/Insurance Housing Social Support Transportation Vocational/Educational  ADL's:  Intact  Cognition:  WNL  Sleep:         COGNITIVE FEATURES THAT CONTRIBUTE TO RISK:  None    SUICIDE RISK:   Moderate:  Frequent suicidal ideation with limited intensity, and duration, some specificity in terms of plans, no associated intent, good self-control, limited dysphoria/symptomatology, some risk factors present, and identifiable protective factors, including available and accessible social support.  PLAN OF CARE: Continue inpatient admission at this time, and convert to voluntary admission. Start Prozac 20 mg daily. Will continue Abilify 10 mg at this time as it was modestly helpful in the past. Resume home medications.   I certify that inpatient services furnished can reasonably be expected to improve the patient's condition.   Jesse Sans, MD 01/02/2020, 11:00 AM

## 2020-01-02 NOTE — Progress Notes (Signed)
Recreation Therapy Notes  INPATIENT RECREATION THERAPY ASSESSMENT  Patient Details Name: Ashley Valencia MRN: [id number] DOB: [birth year] Today's Date: 01/02/2020       Information Obtained From: Patient  Able to Participate in Assessment/Interview: Yes  Patient Presentation: Responsive  Reason for Admission (Per Patient): Active Symptoms, Suicidal Ideation, Impulsive Behavior, Suicide Attempt  Patient Stressors:    Coping Skills:   Music, Art, Write  Leisure Interests (2+):  Music - Listen, Art - Draw, Art - Coloring, Music - Write music  Frequency of Recreation/Participation: Monthly  Awareness of Community Resources:     [employer]:     Current Use:    If no, Barriers?:    Expressed Interest in [employer] Information:    [place] of Residence:  [place]  Patient Main Form of Transportation: Set designer  Patient Strengths:  Automotive engineer, integrity  Patient Identified Areas of Improvement:  Control stressors  Patient Goal for Hospitalization:  To get better  Current SI (including self-harm):  No  Current HI:  No  Current AVH: No  Staff Intervention Plan: Collaborate with Interdisciplinary Treatment Team, Group Attendance  Consent to Intern Participation: N/A  Evangaline Jou 01/02/2020, 2:51 PM

## 2020-01-02 NOTE — [facility] Group Notes (Signed)
LCSW Group Therapy Note  01/02/2020 2:25 PM  Type of Therapy and Topic:  Group Therapy:  Feelings around Relapse and Recovery  Participation Level:  None   Description of Group:    Patients in this group will discuss emotions they experience before and after a relapse. They will process how experiencing these feelings, or avoidance of experiencing them, relates to having a relapse. Facilitator will guide patients to explore emotions they have related to recovery. Patients will be encouraged to process which emotions are more powerful. They will be guided to discuss the emotional reaction significant others in their lives may have to their relapse or recovery. Patients will be assisted in exploring ways to respond to the emotions of others without this contributing to a relapse.  Therapeutic Goals: 1. Patient will identify two or more emotions that lead to a relapse for them 2. Patient will identify two emotions that result when they relapse 3. Patient will identify two emotions related to recovery 4. Patient will demonstrate ability to communicate their needs through discussion and/or role plays   Summary of Patient Progress: Patient was present in group, however, did not engage in discussion.   Therapeutic Modalities:   Cognitive Behavioral Therapy Solution-Focused Therapy Assertiveness Training Relapse Prevention Therapy   Penni Homans, MSW, LCSW 01/02/2020 2:25 PM

## 2020-01-02 NOTE — Progress Notes (Signed)
Recreation Therapy Notes  Date: 01/02/2020  Time: 9:30 am   Location: Craft room   Behavioral response: Appropriate  Intervention Topic: Communication   Discussion/Intervention:  Group content today was focused on communication. The group defined communication and ways to communicate with others. Individuals stated reason why communication is important and some reasons to communicate with others. Patients expressed if they thought they were good at communicating with others and ways they could improve their communication skills. The group identified important parts of communication and some experiences they have had in the past with communication. The group participated in the intervention "What is that?", where they had a chance to test out their communication skills and identify ways to improve their communication techniques.  Clinical Observations/Feedback: Patient came to group late and was focused on what peers and staff had to say about communication. Individual was social with peers and staff while participating in the intervention.  Alaysiah Browder LRT/CTRS           Marc Sivertsen 01/02/2020 11:55 AM

## 2020-01-02 NOTE — Plan of Care (Signed)
D- Patient alert and oriented. Patient presents in a depressed, but pleasant mood on assessment stating that she slept good last night and had no complaints to voice to this Clinical research associate. Patient endorsed depression and anxiety, reporting that "the same things" she told me yesterday is why she feels this way.  Patient reported yesterday that she is "clinically depressed, so I'm depressed all the time". Patient also stated that her anxiety is a little high because of "being in a new place". Patient denies SI, HI, AVH, and pain at this time. Patient's goal for today is "getting better", in which she will "have a positive attitude", in order to achieve her goal.  A- Scheduled medications administered to patient, per MD orders. Support and encouragement provided.  Routine safety checks conducted every 15 minutes.  Patient informed to notify staff with problems or concerns.  R- No adverse drug reactions noted. Patient contracts for safety at this time. Patient compliant with medications and treatment plan. Patient receptive, calm, and cooperative. Patient interacts well with others on the unit.  Patient remains safe at this time.  Problem: Education: Goal: Knowledge of [facility] Education information/materials will improve Outcome: Progressing Goal: Emotional status will improve Outcome: Progressing Goal: Mental status will improve Outcome: Progressing Goal: Verbalization of understanding the information provided will improve Outcome: Progressing   Problem: Safety: Goal: Periods of time without injury will increase Outcome: Progressing   Problem: Coping: Goal: Coping ability will improve Outcome: Progressing Goal: Will verbalize feelings Outcome: Progressing   Problem: Self-Concept: Goal: Level of anxiety will decrease Outcome: Progressing   Problem: Coping: Goal: Coping ability will improve Outcome: Progressing   Problem: Self-Concept: Goal: Ability to disclose and discuss suicidal  ideas will improve Outcome: Progressing

## 2020-01-02 NOTE — Tx Team (Addendum)
Interdisciplinary Treatment and Diagnostic Plan Update  01/02/2020 Time of Session: 9:00AM Ashley Valencia MRN: [id number]  Principal Diagnosis: <principal problem not specified>  Secondary Diagnoses: Active Problems:   Severe recurrent major depression without psychotic features (HCC)   Current Medications:  Current Facility-Administered Medications  Medication Dose Route Frequency Provider Last Rate Last Admin   acetaminophen (TYLENOL) tablet 650 mg  650 mg Oral Q6H PRN Clapacs, John T, MD       alum & mag hydroxide-simeth (MAALOX/MYLANTA) 200-200-20 MG/5ML suspension 30 mL  30 mL Oral Q4H PRN Clapacs, John T, MD       ARIPiprazole (ABILIFY) tablet 10 mg  10 mg Oral Daily Salley Scarlet, MD       darifenacin (ENABLEX) 24 hr tablet 15 mg  15 mg Oral Daily Selina Cooley M, MD       FLUoxetine (PROZAC) capsule 20 mg  20 mg Oral Daily Salley Scarlet, MD       hydrOXYzine (ATARAX/VISTARIL) tablet 50 mg  50 mg Oral TID PRN Clapacs, Madie Reno, MD   50 mg at 01/01/20 2110   magnesium hydroxide (MILK OF MAGNESIA) suspension 30 mL  30 mL Oral Daily PRN Clapacs, Madie Reno, MD       propranolol (INDERAL) tablet 40 mg  40 mg Oral Daily Selina Cooley M, MD       traZODone (DESYREL) tablet 100 mg  100 mg Oral QHS PRN Clapacs, Madie Reno, MD   100 mg at 01/01/20 2110   PTA Medications: Medications Prior to Admission  Medication Sig Dispense Refill Last Dose   ARIPiprazole (ABILIFY) 10 MG tablet Take 10 mg by mouth daily.      EMGALITY 120 MG/ML SOAJ Inject 1 mL into the skin every 30 (thirty) days.      methylPREDNISolone (MEDROL) 4 MG tablet 6-day taper to be taken as directed (Patient not taking: Reported on 07/03/2017) 21 tablet 0    propranolol (INDERAL) 40 MG tablet Take 40 mg by mouth daily.      solifenacin (VESICARE) 10 MG tablet Take 10 mg by mouth daily.      traMADol (ULTRAM) 50 MG tablet Take 1 tablet (50 mg total) by mouth 2 (two) times daily as needed. (Patient not taking:  Reported on 01/01/2020) 30 tablet 0     Patient Stressors: Marital or family conflict Other: Work issues  Patient Strengths: Curator fund of knowledge Supportive family/friends Work skills  Treatment Modalities: Medication Management, Group therapy, Case management,  1 to 1 session with clinician, Psychoeducation, Recreational therapy.   Physician Treatment Plan for Primary Diagnosis: <principal problem not specified> Long Term Goal(s):     Short Term Goals:    Medication Management: Evaluate patient's response, side effects, and tolerance of medication regimen.  Therapeutic Interventions: 1 to 1 sessions, Unit Group sessions and Medication administration.  Evaluation of Outcomes: Not Met  Physician Treatment Plan for Secondary Diagnosis: Active Problems:   Severe recurrent major depression without psychotic features ([place])  Long Term Goal(s):     Short Term Goals:       Medication Management: Evaluate patient's response, side effects, and tolerance of medication regimen.  Therapeutic Interventions: 1 to 1 sessions, Unit Group sessions and Medication administration.  Evaluation of Outcomes: Not Met   RN Treatment Plan for Primary Diagnosis: <principal problem not specified> Long Term Goal(s): Knowledge of disease and therapeutic regimen to maintain health will improve  Short Term Goals: Ability to verbalize frustration and anger appropriately  will improve, Ability to demonstrate self-control, Ability to participate in decision making will improve, Ability to verbalize feelings will improve, Ability to disclose and discuss suicidal ideas, Ability to identify and develop effective coping behaviors will improve and Compliance with prescribed medications will improve  Medication Management: RN will administer medications as ordered by provider, will assess and evaluate patient's response and provide education to patient for prescribed medication. RN will  report any adverse and/or side effects to prescribing provider.  Therapeutic Interventions: 1 on 1 counseling sessions, Psychoeducation, Medication administration, Evaluate responses to treatment, Monitor vital signs and CBGs as ordered, Perform/monitor CIWA, COWS, AIMS and Fall Risk screenings as ordered, Perform wound care treatments as ordered.  Evaluation of Outcomes: Not Met   LCSW Treatment Plan for Primary Diagnosis: <principal problem not specified> Long Term Goal(s): Safe transition to appropriate next level of care at discharge, Engage patient in therapeutic group addressing interpersonal concerns.  Short Term Goals: Engage patient in aftercare planning with referrals and resources, Increase social support, Increase ability to appropriately verbalize feelings, Increase emotional regulation and Increase skills for wellness and recovery  Therapeutic Interventions: Assess for all discharge needs, 1 to 1 time with Social worker, Explore available resources and support systems, Assess for adequacy in community support network, Educate family and significant other(s) on suicide prevention, Complete Psychosocial Assessment, Interpersonal group therapy.  Evaluation of Outcomes: Not Met   Progress in Treatment: Attending groups: No. Participating in groups: No. Taking medication as prescribed: Yes. Toleration medication: Yes. Family/Significant other contact made: No, will contact:  once permission is given. Patient understands diagnosis: Yes. Discussing patient identified problems/goals with staff: Yes. Medical problems stabilized or resolved: Yes. Denies suicidal/homicidal ideation: Yes. Issues/concerns per patient self-inventory: No. Other: none  New problem(s) identified: No, Describe:  none  New Short Term/Long Term Goal(s): detox, elimination of symptoms of psychosis, medication management for mood stabilization; elimination of SI thoughts; development of comprehensive mental  wellness/sobriety plan.  Patient Goals:  "not thinking, just staying busy"  Discharge Plan or Barriers: Pt reports plans to return home and begin outpatient.   Reason for Continuation of Hospitalization: Anxiety Depression Medication stabilization Suicidal ideation  Estimated Length of Stay:  1-7 days  Recreational Therapy: Patient Stressors: N/A Patient Goal: Patient will engage in groups without prompting or encouragement from LRT x3 group sessions within 5 recreation therapy group sessions.    Attendees: Patient: Ashley Valencia 01/02/2020 10:29 AM  Physician: Dr. Domingo Cocking, MD 01/02/2020 10:29 AM  Nursing: Graceann Congress, RN 01/02/2020 10:29 AM  RN Care Manager: 01/02/2020 10:29 AM  Social Worker: Assunta Curtis, LCSW 01/02/2020 10:29 AM  Recreational Therapist: Roanna Epley, Reather Converse, LRT 01/02/2020 10:29 AM  Other: Michell Heinrich, LCSW 01/02/2020 10:29 AM  Other:  01/02/2020 10:29 AM  Other: 01/02/2020 10:29 AM    Scribe for Treatment Team: Rozann Lesches, LCSW 01/02/2020 10:29 AM

## 2020-01-02 NOTE — H&P (Signed)
Psychiatric Admission Assessment Adult  Patient Identification: Ashley Valencia MRN:  [id number] Date of Evaluation:  01/02/2020 Chief Complaint:  Severe recurrent major depression without psychotic features (HCC) [F33.2] Principal Diagnosis: Severe recurrent major depression without psychotic features (HCC) Diagnosis:  Principal Problem:   Severe recurrent major depression without psychotic features (HCC)  History of Present Illness:  Ashley Valencia was seen in treatment team and one-on-one. She states that she was feeling increasingly depressed after her second marriage had failed. She states she impulsively took Klonopin in a suicide attempt. She was relieved to wake up from this overdose, and regrets the attempt. She states she never wants to attempt suicide again. She has been on Abilify 10 mg daily for about a year. She feels it was initially helpful, but feels it wore off. She has been on Zoloft and Amitriptyline in the past without good effect. She notes that she continues to experience depression with low energy, over sleeping, anhedonia, depressed mood, and poor appetite. She also reports anxiety particularly when around new people. 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 She has a friend that works with her that is her main source of support. She feels she would benefit from change in medication at this time. Discussed risks, benefits, and alternatives of Prozac and Remeron, and patient opts to try Prozac at this time. She also is interested in having an outpatient psychiatrist at discharge. Will convert to voluntary admission at this time.   Associated Signs/Symptoms: Depression Symptoms:  depressed mood, anhedonia, hypersomnia, psychomotor retardation, fatigue, feelings of worthlessness/guilt, difficulty concentrating, recurrent thoughts of death, suicidal attempt, decreased appetite, Duration of Depression Symptoms: No  data recorded (Hypo) Manic Symptoms:  Impulsivity, Anxiety Symptoms:  Excessive Worry, Panic Symptoms, Social Anxiety, Psychotic Symptoms:  None Duration of Psychotic Symptoms: No data recorded PTSD Symptoms: Negative Total Time spent with patient: 1 hour  Past Psychiatric History: Patient treated via PCP for depression with Abilify, Zoloft, and amitriptyline in the past. No previous inpatient admission. Suicide attempt prior to admission via Klonopin overdose. No other prior suicide attempts.   Is the patient at risk to self? Yes.    Has the patient been a risk to self in the past 6 months? Yes.    Has the patient been a risk to self within the distant past? Yes.    Is the patient a risk to others? No.  Has the patient been a risk to others in the past 6 months? No.  Has the patient been a risk to others within the distant past? No.   Prior Inpatient Therapy:   Prior Outpatient Therapy:    Alcohol Screening: 1. How often do you have a drink containing alcohol?: 2 to 4 times a month 2. How many drinks containing alcohol do you have on a typical day when you are drinking?: 3 or 4 3. How often do you have six or more drinks on one occasion?: Never AUDIT-C Score: 3 4. How often during the last year have you found that you were not able to stop drinking once you had started?: Never 5. How often during the last year have you failed to do what was normally expected from you because of drinking?: Never 6. How often during the last year have you needed a first drink in the morning to get yourself going after a heavy drinking session?: Never 7. How often during the last year have you had a  feeling of guilt of remorse after drinking?: Never 8. How often during the last year have you been unable to remember what happened the night before because you had been drinking?: Never 9. Have you or someone else been injured as a result of your drinking?: No 10. Has a relative or friend or a doctor or  another health worker been concerned about your drinking or suggested you cut down?: No Alcohol Use Disorder Identification Test Final Score (AUDIT): 3 Alcohol Brief Interventions/Follow-up: AUDIT Score <7 follow-up not indicated Substance Abuse History in the last 12 months:  No. Consequences of Substance Abuse: Negative Previous Psychotropic Medications: Yes  Psychological Evaluations: Yes  Past Medical History:  Past Medical History:  Diagnosis Date  . History of bladder problems   . Migraines     Past Surgical History:  Procedure Laterality Date  . UMBILICAL HERNIA REPAIR     age 29   Family History:  Family History  Problem Relation Age of Onset  . Diabetes Mother   . Hypertension Mother   . Kidney failure Mother   . Diabetes Maternal Grandmother   . Hypertension Maternal Grandmother   . Kidney failure Maternal Grandmother   . Diabetes Maternal Grandfather   . Hypertension Maternal Grandfather    Family Psychiatric  History: Father with schizoaffective disorder, bipolar type.  Tobacco Screening: Have you used any form of tobacco in the last 30 days? (Cigarettes, Smokeless Tobacco, Cigars, and/or Pipes): No Social History:  Social History   Substance and Sexual Activity  Alcohol Use Yes   Comment: rarely     Social History   Substance and Sexual Activity  Drug Use No    Additional Social History:      Pain Medications: see PTA Prescriptions: see PTA Over the Counter: see PTA                    Allergies:  No Known Allergies Lab Results:  Results for orders placed or performed during the hospital encounter of 01/01/20 (from the past 48 hour(s))  Respiratory Panel by RT PCR (Flu A&B, Covid) - Nasopharyngeal Swab     Status: None   Collection Time: 01/01/20 12:57 AM   Specimen: Nasopharyngeal Swab  Result Value Ref Range   SARS Coronavirus 2 by RT PCR NEGATIVE NEGATIVE    Comment: (NOTE) SARS-CoV-2 target nucleic acids are NOT DETECTED.  The  SARS-CoV-2 RNA is generally detectable in upper respiratoy specimens during the acute phase of infection. The lowest concentration of SARS-CoV-2 viral copies this assay can detect is 131 copies/mL. A negative result does not preclude SARS-Cov-2 infection and should not be used as the sole basis for treatment or other patient management decisions. A negative result may occur with  improper specimen collection/handling, submission of specimen other than nasopharyngeal swab, presence of viral mutation(s) within the areas targeted by this assay, and inadequate number of viral copies (<131 copies/mL). A negative result must be combined with clinical observations, patient history, and epidemiological information. The expected result is Negative.  Fact Sheet for Patients:  https://www.moore.com/https://www.fda.gov/media/142436/download  Fact Sheet for Healthcare Providers:  https://www.young.biz/https://www.fda.gov/media/142435/download  This test is no t yet approved or cleared by the [place] FDA and  has been authorized for detection and/or diagnosis of SARS-CoV-2 by FDA under an Emergency Use Authorization (EUA). This EUA will remain  in effect (meaning this test can be used) for the duration of the COVID-19 declaration under Section 564(b)(1) of the Act, 21 U.S.C. section 360bbb-3(b)(1), unless  the authorization is terminated or revoked sooner.     Influenza A by PCR NEGATIVE NEGATIVE   Influenza B by PCR NEGATIVE NEGATIVE    Comment: (NOTE) The Xpert Xpress SARS-CoV-2/FLU/RSV assay is intended as an aid in  the diagnosis of influenza from Nasopharyngeal swab specimens and  should not be used as a sole basis for treatment. Nasal washings and  aspirates are unacceptable for Xpert Xpress SARS-CoV-2/FLU/RSV  testing.  Fact Sheet for Patients: https://www.moore.com/  Fact Sheet for Healthcare Providers: https://www.young.biz/  This test is not yet approved or cleared by the [place] FDA and  has been authorized for detection and/or diagnosis of SARS-CoV-2 by  FDA under an Emergency Use Authorization (EUA). This EUA will remain  in effect (meaning this test can be used) for the duration of the  Covid-19 declaration under Section 564(b)(1) of the Act, 21  U.S.C. section 360bbb-3(b)(1), unless the authorization is  terminated or revoked. Performed at [facility], [address] [place], [place] [address]   CBC with Differential     Status: None   Collection Time: 01/01/20  1:20 AM  Result Value Ref Range   WBC 6.6 4.0 - 10.5 K/uL   RBC 4.77 3.87 - 5.11 MIL/uL   Hemoglobin 12.9 12.0 - 15.0 g/dL   HCT 60.4 36 - 46 %   MCV 83.2 80.0 - 100.0 fL   MCH 27.0 26.0 - 34.0 pg   MCHC 32.5 30.0 - 36.0 g/dL   RDW 54.0 98.1 - 19.1 %   Platelets 163 150 - 400 K/uL   nRBC 0.0 0.0 - 0.2 %   Neutrophils Relative % 65 %   Neutro Abs 4.3 1.7 - 7.7 K/uL   Lymphocytes Relative 21 %   Lymphs Abs 1.4 0.7 - 4.0 K/uL   Monocytes Relative 10 %   Monocytes Absolute 0.6 0.1 - 1.0 K/uL   Eosinophils Relative 3 %   Eosinophils Absolute 0.2 0.0 - 0.5 K/uL   Basophils Relative 1 %   Basophils Absolute 0.0 0.0 - 0.1 K/uL   Immature Granulocytes 0 %   Abs Immature Granulocytes 0.02 0.00 - 0.07 K/uL    Comment: Performed at [facility], [address] [place], [place] [address]  Ethanol     Status: Abnormal   Collection Time: 01/01/20  1:20 AM  Result Value Ref Range   Alcohol, Ethyl (B) 62 (H) <10 mg/dL    Comment: (NOTE) Lowest detectable limit for serum alcohol is 10 mg/dL.  For medical purposes only. Performed at [facility] [address] [place], [place] [address]   Rapid urine drug screen (hospital performed)     Status: None   Collection Time: 01/01/20  1:20 AM  Result Value Ref Range   Opiates NONE DETECTED NONE DETECTED   Cocaine NONE DETECTED NONE DETECTED   Benzodiazepines NONE DETECTED NONE DETECTED    Amphetamines NONE DETECTED NONE DETECTED   Tetrahydrocannabinol NONE DETECTED NONE DETECTED   Barbiturates NONE DETECTED NONE DETECTED    Comment: (NOTE) DRUG SCREEN FOR MEDICAL PURPOSES ONLY.  IF CONFIRMATION IS NEEDED FOR ANY PURPOSE, NOTIFY LAB WITHIN 5 DAYS.  LOWEST DETECTABLE LIMITS FOR URINE DRUG SCREEN Drug Class                     Cutoff (ng/mL) Amphetamine and metabolites    1000 Barbiturate and metabolites    200 Benzodiazepine  200 Tricyclics and metabolites     300 Opiates and metabolites        300 Cocaine and metabolites        300 THC                            50 Performed at [facility], [address], [place], [place] [address]   Salicylate level     Status: Abnormal   Collection Time: 01/01/20  1:20 AM  Result Value Ref Range   Salicylate Lvl <7.0 (L) 7.0 - 30.0 mg/dL    Comment: Performed at [facility], [address], [place], [place] [address]  Acetaminophen level     Status: Abnormal   Collection Time: 01/01/20  1:20 AM  Result Value Ref Range   Acetaminophen (Tylenol), Serum <10 (L) 10 - 30 ug/mL    Comment: (NOTE) Therapeutic concentrations vary significantly. A range of 10-30 ug/mL  may be an effective concentration for many patients. However, some  are best treated at concentrations outside of this range. Acetaminophen concentrations >150 ug/mL at 4 hours after ingestion  and >50 ug/mL at 12 hours after ingestion are often associated with  toxic reactions.  Performed at [facility], [address], [place], [place] [address]   Comprehensive metabolic panel     Status: Abnormal   Collection Time: 01/01/20  1:20 AM  Result Value Ref Range   Sodium 145 135 - 145 mmol/L   Potassium 3.2 (L) 3.5 - 5.1 mmol/L   Chloride 111 98 - 111 mmol/L   CO2 21 (L) 22 - 32 mmol/L   Glucose, Bld 105 (H) 70 - 99 mg/dL    Comment: Glucose reference range applies only to samples taken  after fasting for at least 8 hours.   BUN 9 6 - 20 mg/dL   Creatinine, Ser 8.10 0.44 - 1.00 mg/dL   Calcium 9.0 8.9 - 17.5 mg/dL   Total Protein 6.8 6.5 - 8.1 g/dL   Albumin 4.0 3.5 - 5.0 g/dL   AST 16 15 - 41 U/L   ALT 27 0 - 44 U/L   Alkaline Phosphatase 37 (L) 38 - 126 U/L   Total Bilirubin 0.8 0.3 - 1.2 mg/dL   GFR, Estimated >10 >25 mL/min   Anion gap 13 5 - 15    Comment: Performed at [facility], [address], [place], [place] [address]  I-Stat Beta hCG blood, ED ([facility], [facility], [facility] only)     Status: None   Collection Time: 01/01/20  1:40 AM  Result Value Ref Range   I-stat hCG, quantitative <5.0 <5 mIU/mL   Comment 3            Comment:   GEST. AGE      CONC.  (mIU/mL)   <=1 WEEK        5 - 50     2 WEEKS       50 - 500     3 WEEKS       100 - 10,000     4 WEEKS     1,000 - 30,000        FEMALE AND NON-PREGNANT FEMALE:     LESS THAN 5 mIU/mL     Blood Alcohol level:  Lab Results  Component Value Date   ETH 62 (H) 01/01/2020    Metabolic Disorder Labs:  No results found for: HGBA1C,  MPG No results found for: PROLACTIN No results found for: CHOL, TRIG, HDL, CHOLHDL, VLDL, LDLCALC  Current Medications: Current Facility-Administered Medications  Medication Dose Route Frequency Provider Last Rate Last Admin  . acetaminophen (TYLENOL) tablet 650 mg  650 mg Oral Q6H PRN Clapacs, John T, MD      . alum & mag hydroxide-simeth (MAALOX/MYLANTA) 200-200-20 MG/5ML suspension 30 mL  30 mL Oral Q4H PRN Clapacs, John T, MD      . ARIPiprazole (ABILIFY) tablet 10 mg  10 mg Oral Daily Jesse Sans, MD      . darifenacin (ENABLEX) 24 hr tablet 15 mg  15 mg Oral Daily Les Pou M, MD      . FLUoxetine (PROZAC) capsule 20 mg  20 mg Oral Daily Jesse Sans, MD      . hydrOXYzine (ATARAX/VISTARIL) tablet 50 mg  50 mg Oral TID PRN Clapacs, Jackquline Denmark, MD   50 mg at 01/01/20 2110  . magnesium hydroxide (MILK OF MAGNESIA) suspension 30 mL  30 mL Oral Daily PRN  Clapacs, John T, MD      . propranolol (INDERAL) tablet 40 mg  40 mg Oral Daily Les Pou M, MD      . traZODone (DESYREL) tablet 100 mg  100 mg Oral QHS PRN Clapacs, Jackquline Denmark, MD   100 mg at 01/01/20 2110   PTA Medications: Medications Prior to Admission  Medication Sig Dispense Refill Last Dose  . ARIPiprazole (ABILIFY) 10 MG tablet Take 10 mg by mouth daily.     Marland Kitchen EMGALITY 120 MG/ML SOAJ Inject 1 mL into the skin every 30 (thirty) days.     . methylPREDNISolone (MEDROL) 4 MG tablet 6-day taper to be taken as directed (Patient not taking: Reported on 07/03/2017) 21 tablet 0   . propranolol (INDERAL) 40 MG tablet Take 40 mg by mouth daily.     . solifenacin (VESICARE) 10 MG tablet Take 10 mg by mouth daily.     . traMADol (ULTRAM) 50 MG tablet Take 1 tablet (50 mg total) by mouth 2 (two) times daily as needed. (Patient not taking: Reported on 01/01/2020) 30 tablet 0     Musculoskeletal: Strength & Muscle Tone: within normal limits Gait & Station: normal Patient leans: N/A  Psychiatric Specialty Exam: Physical Exam Vitals and nursing note reviewed.  Constitutional:      Appearance: Normal appearance.  HENT:     Head: Normocephalic and atraumatic.     Right Ear: External ear normal.     Left Ear: External ear normal.     Nose: Nose normal.     Mouth/Throat:     Mouth: Mucous membranes are moist.     Pharynx: Oropharynx is clear.  Eyes:     Extraocular Movements: Extraocular movements intact.     Conjunctiva/sclera: Conjunctivae normal.     Pupils: Pupils are equal, round, and reactive to light.  Cardiovascular:     Rate and Rhythm: Normal rate.     Pulses: Normal pulses.  Pulmonary:     Effort: Pulmonary effort is normal.     Breath sounds: Normal breath sounds.  Abdominal:     General: Abdomen is flat.     Palpations: Abdomen is soft.  Musculoskeletal:        General: No swelling. Normal range of motion.     Cervical back: Normal range of motion and neck supple.   Skin:    General: Skin is warm and dry.  Neurological:  General: No focal deficit present.     Mental Status: She is alert and oriented to person, place, and time.  Psychiatric:        Attention and Perception: She does not perceive auditory or visual hallucinations.        Mood and Affect: Mood is depressed. Affect is blunt.        Speech: Speech normal.        Behavior: Behavior normal.        Thought Content: Thought content includes suicidal ideation.        Cognition and Memory: Cognition and memory normal.        Judgment: Judgment is impulsive.     Review of Systems  Constitutional: Positive for appetite change and fatigue.  HENT: Negative for rhinorrhea and sore throat.   Eyes: Negative for photophobia and visual disturbance.  Respiratory: Negative for cough and shortness of breath.   Cardiovascular: Negative for chest pain and palpitations.  Gastrointestinal: Negative for constipation, diarrhea, nausea and vomiting.  Endocrine: Negative for cold intolerance and heat intolerance.  Genitourinary: Negative for difficulty urinating and dysuria.  Musculoskeletal: Negative for arthralgias and myalgias.  Skin: Negative for rash and wound.  Allergic/Immunologic: Negative for environmental allergies and food allergies.  Neurological: Negative for dizziness and headaches.  Hematological: Negative for adenopathy. Does not bruise/bleed easily.  Psychiatric/Behavioral: Positive for dysphoric mood, sleep disturbance and suicidal ideas. The patient is nervous/anxious.     Blood pressure (!) 146/95, pulse 85, temperature 97.8 F (36.6 C), temperature source Oral, resp. rate 16, height 6' (1.829 m), weight 107 kg, SpO2 100 %.Body mass index is 32.01 kg/m.  General Appearance: Fairly Groomed  Eye Contact:  Fair  Speech:  Normal Rate  Volume:  Normal  Mood:  Anxious and Depressed  Affect:  Congruent  Thought Process:  Coherent  Orientation:  Full (Time, Place, and Person)   Thought Content:  Logical  Suicidal Thoughts:  Yes.  without intent/plan  Homicidal Thoughts:  No  Memory:  Immediate;   Fair Recent;   Fair Remote;   Fair  Judgement:  Intact  Insight:  Fair  Psychomotor Activity:  Normal  Concentration:  Concentration: Good and Attention Span: Good  Recall:  Good  Fund of Knowledge:  Good  Language:  Good  Akathisia:  Negative  Handed:  Right  AIMS (if indicated):     Assets:  Communication Skills Desire for Improvement Financial Resources/Insurance Housing Social Support Transportation Vocational/Educational  ADL's:  Intact  Cognition:  WNL  Sleep:         Treatment Plan Summary: Daily contact with patient to assess and evaluate symptoms and progress in treatment and Medication management Plan: Continue inpatient admission at this time, and convert to voluntary admission. Start Prozac 20 mg daily. Will continue Abilify 10 mg at this time as it was modestly helpful in the past. Resume home medications. Continue to monitor blood pressure. Patient typically normotensive at 122/68.   Observation Level/Precautions:  15 minute checks  Laboratory:  lipid panel, hemoglobin A1c  Psychotherapy:  As tolerated  Medications:  As above  Consultations:    Discharge Concerns:    Estimated LOS: 5 days  Other:     Physician Treatment Plan for Primary Diagnosis: Severe recurrent major depression without psychotic features (HCC) Long Term Goal(s): Improvement in symptoms so as ready for discharge  Short Term Goals: Ability to identify changes in lifestyle to reduce recurrence of condition will improve, Ability to verbalize feelings will improve,  Ability to disclose and discuss suicidal ideas, Ability to demonstrate self-control will improve and Ability to identify and develop effective coping behaviors will improve  Physician Treatment Plan for Secondary Diagnosis: Principal Problem:   Severe recurrent major depression without psychotic features  (HCC)  Long Term Goal(s): Improvement in symptoms so as ready for discharge  Short Term Goals: Ability to identify changes in lifestyle to reduce recurrence of condition will improve, Ability to verbalize feelings will improve, Ability to disclose and discuss suicidal ideas, Ability to demonstrate self-control will improve and Ability to identify and develop effective coping behaviors will improve  I certify that inpatient services furnished can reasonably be expected to improve the patient's condition.    Jesse Sans, MD 10/22/202111:13 AM

## 2020-01-02 NOTE — [facility] Suicide Risk Assessment (Signed)
[facility] INPATIENT:  Family/Significant Other Suicide Prevention Education  Suicide Prevention Education:  Patient Refusal for Family/Significant Other Suicide Prevention Education: The patient Ashley Valencia has refused to provide written consent for family/significant other to be provided Family/Significant Other Suicide Prevention Education during admission and/or prior to discharge.  Physician notified.  SPE completed with pt, as pt refused to consent to family contact. SPI pamphlet provided to pt and pt was encouraged to share information with support network, ask questions, and talk about any concerns relating to SPE. Pt denies access to guns/firearms and verbalized understanding of information provided. Mobile Crisis information also provided to pt.  Glenis Smoker 01/02/2020, 11:44 AM
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
## 2020-01-03 LAB — LIPID PANEL
Cholesterol: 192 mg/dL (ref 0–200)
HDL: 44 mg/dL (ref >40)
LDL Cholesterol: 129 mg/dL — ABNORMAL HIGH (ref 0–99)
Total CHOL/HDL Ratio: 4.4 RATIO
Triglycerides: 95 mg/dL (ref <150)
VLDL: 19 mg/dL (ref 0–40)

## 2020-01-03 NOTE — Plan of Care (Signed)
D- Patient alert and oriented. Patient presents in a sad, but pleasant mood on assessment stating that she slept "ok, I'm just sleepy this morning", and had no complaints or concerns to voice to this Clinical research associate. Patient continues to endorse depression and anxiety, stating that "being here", has her feeling this way. Patient denies SI, HI, AVH, and pain at this time. Patient's goal for today is "getting better", in which she will "have a positive attitude", in order to achieve her goal.  A- Scheduled medications administered to patient, per MD orders. Support and encouragement provided.  Routine safety checks conducted every 15 minutes.  Patient informed to notify staff with problems or concerns.  R- No adverse drug reactions noted. Patient contracts for safety at this time. Patient compliant with medications and treatment plan. Patient receptive, calm, and cooperative. Patient interacts well with others on the unit.  Patient remains safe at this time.  Problem: Education: Goal: Knowledge of [facility] Education information/materials will improve Outcome: Progressing Goal: Emotional status will improve Outcome: Progressing Goal: Mental status will improve Outcome: Progressing Goal: Verbalization of understanding the information provided will improve Outcome: Progressing   Problem: Safety: Goal: Periods of time without injury will increase Outcome: Progressing   Problem: Coping: Goal: Coping ability will improve Outcome: Progressing Goal: Will verbalize feelings Outcome: Progressing   Problem: Self-Concept: Goal: Level of anxiety will decrease Outcome: Progressing   Problem: Coping: Goal: Coping ability will improve Outcome: Progressing   Problem: Self-Concept: Goal: Ability to disclose and discuss suicidal ideas will improve Outcome: Progressing

## 2020-01-03 NOTE — [facility] Group Notes (Signed)
LCSW Group Therapy Note  01/03/2020  1:00 PM   Type of Therapy and Topic:  Group Therapy:  Trust and Honesty   Participation Level:  Minimal   Description of Group:    In this group patients will be asked to explore the value of being honest.  Patients will be guided to discuss their thoughts, feelings, and behaviors related to honesty and trusting in others. Patients will process together how trust and honesty relate to forming relationships with peers, family members, and self. Each patient will be challenged to identify and express feelings of being vulnerable. Patients will discuss reasons why people are dishonest and identify alternative outcomes if one was truthful (to self or others). This group will be process-oriented, with patients participating in exploration of their own experiences, giving and receiving support, and processing challenge from other group members.   Therapeutic Goals: 1. Patient will identify why honesty is important to relationships and how honesty overall affects relationships.  2. Patient will identify a situation where they lied or were lied too and the  feelings, thought process, and behaviors surrounding the situation 3. Patient will identify the meaning of being vulnerable, how that feels, and how that correlates to being honest with self and others. 4. Patient will identify situations where they could have told the truth, but instead lied and explain reasons of dishonesty.   Summary of Patient Progress:  Patient was present in group. Patient shared that she struggles with trust because it makes her feel vulnerable.  Patient reported belief that vulnerability was not a good thing.  Patient was able to engage in discussion on the merits of that statement.  She was able to identify that vulnerability allows for growth.     Therapeutic Modalities:   Cognitive Behavioral Therapy Solution Focused Therapy Motivational Interviewing Brief Therapy  Penni Homans, MSW, LCSW 01/03/2020 10:57 AM

## 2020-01-03 NOTE — Progress Notes (Signed)
Patient alert and oriented x 4, affect is blunted, thoughts are organized and coherent, she was visible in the milieu this evening minimal interaction with peers   and staff, she denies SI/HI/AVH complaint with medication regimen , 15 minutes safety checks maintained will continue to monitor.

## 2020-01-03 NOTE — Progress Notes (Addendum)
[facility] MD Progress Note  01/03/2020 9:21 AM Ashley Valencia  MRN:  [id number] Subjective:   Ronni Rumble recaps the events that had transpired prior to her admission.  Says that she is struggled with chronic depression since her mother passed away 06/28/17; depression has been moderate until her wife of 8 months told her "times up" on Wed of last week.  This was an emotional jolt for her.  She impulsively took 16 Klonopin 0.5 mg tablets.  They still talk every day she is going to be moving out but for the time being still remains her main emotional support.  No side effects from the Prozac that was started no new medical issues.  No thoughts of suicide.  This is her first attempt. Patient states that she is socializing more and acclimating to the unit.  Says that she is a shy person to begin with.  Wonders when her discharge is.  Told her that this will be discussed with her by the primary team  Principal Problem: Severe recurrent major depression without psychotic features (HCC) Diagnosis: Principal Problem:   Severe recurrent major depression without psychotic features (HCC)  Total Time spent with patient: 30 minutes   Past Medical History:  Past Medical History:  Diagnosis Date  . History of bladder problems   . Migraines     Past Surgical History:  Procedure Laterality Date  . UMBILICAL HERNIA REPAIR     age 54   Family History:   Social History:  Social History   Substance and Sexual Activity  Alcohol Use Yes   Comment: rarely     Social History   Substance and Sexual Activity  Drug Use No    Social History   Socioeconomic History  . Marital status: Married    Spouse name: Not on file  . Number of children: Not on file  . Years of education: Not on file  . Highest education level: Not on file  Occupational History  . Not on file  Tobacco Use  . Smoking status: Never Smoker  . Smokeless tobacco: Never Used  Substance and Sexual Activity  . Alcohol use: Yes    Comment:  rarely  . Drug use: No  . Sexual activity: Not on file  Other Topics Concern  . Not on file  Social History Narrative  . Not on file   Social Determinants of Health   Financial Resource Strain:   . Difficulty of Paying Living Expenses: Not on file  Food Insecurity:   . Worried About Programme researcher, broadcasting/film/video in the Last Year: Not on file  . Ran Out of Food in the Last Year: Not on file  Transportation Needs:   . Lack of Transportation (Medical): Not on file  . Lack of Transportation (Non-Medical): Not on file  Physical Activity:   . Days of Exercise per Week: Not on file  . Minutes of Exercise per Session: Not on file  Stress:   . Feeling of Stress : Not on file  Social Connections:   . Frequency of Communication with Friends and Family: Not on file  . Frequency of Social Gatherings with Friends and Family: Not on file  . Attends Religious Services: Not on file  . Active Member of Clubs or Organizations: Not on file  . Attends Banker Meetings: Not on file  . Marital Status: Not on file   Additional Social History:    Pain Medications: see PTA Prescriptions: see PTA Over the Counter: see  PTA                    Sleep: Good  Appetite:  Good  Current Medications: Current Facility-Administered Medications  Medication Dose Route Frequency Provider Last Rate Last Admin  . acetaminophen (TYLENOL) tablet 650 mg  650 mg Oral Q6H PRN Clapacs, Jackquline Denmark, MD   650 mg at 01/02/20 1228  . alum & mag hydroxide-simeth (MAALOX/MYLANTA) 200-200-20 MG/5ML suspension 30 mL  30 mL Oral Q4H PRN Clapacs, John T, MD      . ARIPiprazole (ABILIFY) tablet 10 mg  10 mg Oral Daily Jesse Sans, MD   10 mg at 01/03/20 0830  . darifenacin (ENABLEX) 24 hr tablet 15 mg  15 mg Oral Daily Jesse Sans, MD   15 mg at 01/03/20 0830  . FLUoxetine (PROZAC) capsule 20 mg  20 mg Oral Daily Les Pou M, MD   20 mg at 01/03/20 0830  . hydrOXYzine (ATARAX/VISTARIL) tablet 50 mg  50 mg  Oral TID PRN Clapacs, Jackquline Denmark, MD   50 mg at 01/02/20 2115  . magnesium hydroxide (MILK OF MAGNESIA) suspension 30 mL  30 mL Oral Daily PRN Clapacs, John T, MD      . propranolol (INDERAL) tablet 40 mg  40 mg Oral Daily Jesse Sans, MD   40 mg at 01/03/20 0831  . traZODone (DESYREL) tablet 100 mg  100 mg Oral QHS PRN Clapacs, Jackquline Denmark, MD   100 mg at 01/02/20 2115    Lab Results:  Results for orders placed or performed during the hospital encounter of 01/01/20 (from the past 48 hour(s))  Lipid panel     Status: Abnormal   Collection Time: 01/03/20  8:26 AM  Result Value Ref Range   Cholesterol 192 0 - 200 mg/dL   Triglycerides 95 <779 mg/dL   HDL 44 >39 mg/dL   Total CHOL/HDL Ratio 4.4 RATIO   VLDL 19 0 - 40 mg/dL   LDL Cholesterol 030 (H) 0 - 99 mg/dL    Comment:        Total Cholesterol/HDL:CHD Risk Coronary Heart Disease Risk Table                     Men   Women  1/2 Average Risk   3.4   3.3  Average Risk       5.0   4.4  2 X Average Risk   9.6   7.1  3 X Average Risk  23.4   11.0        Use the calculated Patient Ratio above and the CHD Risk Table to determine the patient's CHD Risk.        ATP III CLASSIFICATION (LDL):  <100     mg/dL   Optimal  092-330  mg/dL   Near or Above                    Optimal  130-159  mg/dL   Borderline  076-226  mg/dL   High  >333     mg/dL   Very High Performed at [facility], [address] [place], [place] [address]     Blood Alcohol level:  Lab Results  Component Value Date   ETH 62 (H) 01/01/2020    Metabolic Disorder Labs: No results found for: HGBA1C, MPG No results found for: PROLACTIN Lab Results  Component Value Date   CHOL 192 01/03/2020   TRIG 95 01/03/2020  HDL 44 01/03/2020   CHOLHDL 4.4 01/03/2020   VLDL 19 01/03/2020   LDLCALC 129 (H) 01/03/2020    Physical Findings: AIMS:  , ,  ,  ,    CIWA:    COWS:     Musculoskeletal: Strength & Muscle Tone: within normal limits Gait & Station:  normal Patient leans: Right  Psychiatric Specialty Exam: Physical Exam  Review of Systems  Blood pressure (!) 133/91, pulse 66, temperature 98 F (36.7 C), temperature source Oral, resp. rate 16, height 6' (1.829 m), weight 107 kg, SpO2 100 %.Body mass index is 32.01 kg/m.   General Appearance:Fairly Groomed  Eye Contact:Fair  Speech:Normal Rate  Volume:Normal  Mood:Better  Affect:neutral  Thought Process:Coherent  Orientation:Full (Time, Place, and Person)  Thought Content:Logical  Suicidal Thoughts:Yes.without intent/plan  Homicidal Thoughts:No  Memory:Immediate;Fair Recent;Fair Remote;Fair  Judgement:improving, fair to good  Insight:Fair  Psychomotor Activity:Normal  Concentration:Concentration:Goodand Attention Span: Good  Recall:Good  Fund of Knowledge:Good  Language:Good  Akathisia:Negative  Handed:Right  AIMS (if indicated):   Assets:Communication Skills Desire for Improvement Financial Resources/Insurance Housing Social Support Transportation Vocational/Educational  ADL's:Intact  Cognition:WNL  Sleep:      Treatment Plan Summary: Daily contact with patient to assess and evaluate symptoms and progress in treatment and Medication management Plan: Continue inpatient admission at this time, and convert to voluntary admission. Start Prozac 20 mg daily. Will continue Abilify 10 mg at this time as it was modestly helpful in the past. Resume home medications. Continue to monitor blood pressure. Patient typically normotensive at 122/68.   01/03/20 Lipid panel reviewed, LDL 129.  Recommend diet control Pending globin A1c No changes No symptoms of any EPS or any mass  Reggie Pile, MD 01/03/2020, 9:21 AM

## 2020-01-03 NOTE — B[facility] Group Notes (Signed)
[facility] Group Notes:  (Nursing/MHT/Case Management/Adjunct)  Date:  01/03/2020  Time:  9:48 PM  Type of Therapy:  Group Therapy  Participation Level:  Active  Participation Quality:  Appropriate  Affect:  Appropriate  Cognitive:  Alert  Insight:  Good  Engagement in Group:  Engaged and had a good day.  Modes of Intervention:  Support  Summary of Progress/Problems:  Mayra Neer 01/03/2020, 9:48 PM

## 2020-01-03 NOTE — B[facility] Group Notes (Signed)
Patient was present in AA/NA group.   Brannon Decaire, MSW, LCSW 01/03/2020 2:28 PM  

## 2020-01-04 NOTE — Plan of Care (Signed)
D- Patient alert and oriented. Patient presents in a pleasant mood on assessment stating that she ok last night and had no complaints to voice to this Clinical research associate. Patient continues to endorse both depression/anxiety, rating them both a "5/10", however, she does report that she is "doing a whole lot better". Patient denies SI, HI, AVH, and pain at this time. Patient's goal for today is once again, "getting better", in which she will "have a positive attitude", in order to achieve her goal.  A- Scheduled medications administered to patient, per MD orders. Support and encouragement provided.  Routine safety checks conducted every 15 minutes.  Patient informed to notify staff with problems or concerns.  R- No adverse drug reactions noted. Patient contracts for safety at this time. Patient compliant with medications and treatment plan. Patient receptive, calm, and cooperative. Patient interacts well with others on the unit.  Patient remains safe at this time.  Problem: Education: Goal: Knowledge of [facility] Education information/materials will improve Outcome: Progressing Goal: Emotional status will improve Outcome: Progressing Goal: Mental status will improve Outcome: Progressing Goal: Verbalization of understanding the information provided will improve Outcome: Progressing   Problem: Safety: Goal: Periods of time without injury will increase Outcome: Progressing   Problem: Coping: Goal: Coping ability will improve Outcome: Progressing Goal: Will verbalize feelings Outcome: Progressing   Problem: Self-Concept: Goal: Level of anxiety will decrease Outcome: Progressing   Problem: Coping: Goal: Coping ability will improve Outcome: Progressing   Problem: Self-Concept: Goal: Ability to disclose and discuss suicidal ideas will improve Outcome: Progressing

## 2020-01-04 NOTE — Progress Notes (Signed)
Patient is pleasant and cooperative.  She denies SI  HI AVH anxiety and pain at this encounter.  She does endorse depression, but states she is getting better. She has been active on the unit engaging well with others and attending group.  She is med compliant and tolerated her medication without incident.  She is safe on the unit with 15 minute safety checks and informed to contact staff with any concerns.     Cleo Butler-Nicholson, LPN

## 2020-01-04 NOTE — Plan of Care (Signed)
  Problem: Education: Goal: Knowledge of [facility] Education information/materials will improve Outcome: Progressing Goal: Emotional status will improve Outcome: Progressing Goal: Mental status will improve Outcome: Progressing Goal: Verbalization of understanding the information provided will improve Outcome: Progressing   Problem: Safety: Goal: Periods of time without injury will increase Outcome: Progressing   Problem: Coping: Goal: Coping ability will improve Outcome: Progressing Goal: Will verbalize feelings Outcome: Progressing   Problem: Self-Concept: Goal: Level of anxiety will decrease Outcome: Progressing   Problem: Coping: Goal: Coping ability will improve Outcome: Progressing   Problem: Self-Concept: Goal: Ability to disclose and discuss suicidal ideas will improve Outcome: Progressing

## 2020-01-04 NOTE — B[facility] Group Notes (Signed)
   LCSW Group Therapy Note     01/04/2020 9:30 AM- 10:07 AM      Type of Therapy and Topic:  Group Therapy:  Overcoming Obstacles     Participation Level:  Active     Description of Group:     In this group patients will be encouraged to explore what they see as obstacles to their own wellness and recovery. They will be guided to discuss their thoughts, feelings, and behaviors related to these obstacles. The group will process together ways to cope with barriers, with attention given to specific choices patients can make. Each patient will be challenged to identify changes they are motivated to make in order to overcome their obstacles. This group will be process-oriented, with patients participating in exploration of their own experiences as well as giving and receiving support and challenge from other group members.     Therapeutic Goals:  1.    Patient will identify personal and current obstacles as they relate to admission.  2.    Patient will identify barriers that currently interfere with their wellness or overcoming obstacles.  3.    Patient will identify feelings, thought process and behaviors related to these barriers.  4.    Patient will identify two changes they are willing to make to overcome these obstacles:        Summary of Patient Progress: Patient checked into group feeling great. Patient stated that she is currently trying to overcome her anxiety and depression. Patient stated that she identifies her wife as a healthy support to her. Patient stated that she was a fear of relapsing. Patient stated that she would like to develop positive coping skills. Patient stated she was using drinking as a way to cope. Patient was provided with a list of coping skills.          Therapeutic Modalities:    Cognitive Behavioral Therapy  Solution Focused Therapy  Motivational Interviewing  Relapse Prevention Therapy    Susa Simmonds, LCSWA   01/04/2020

## 2020-01-04 NOTE — Progress Notes (Signed)
[facility] MD Progress Note  01/04/2020 8:29 AM Ashley Valencia  MRN:  [id number] Subjective:   10/24 Patient knows that she is more social here.  Points out that she is feeling a lot better.  Mentions having a lot of sensory issues with noises and so likes when it is quiet.  She is future oriented, tells me that she looks forward to going back to work and being with her 6 cats.  "I'm a cat person."  She has a Tattoo that she shows me behind her ear.  Denies have any suicidal homicidal ideations.  Slept well.  No new medical issues.  10/23 Ashley Valencia recaps the events that had transpired prior to her admission.  Says that she is struggled with chronic depression since her mother passed away 07-20-17; depression has been moderate until her wife of 8 months told her "times up" on Wed of last week.  This was an emotional jolt for her.  She impulsively took 16 Klonopin 0.5 mg tablets.  They still talk every day she is going to be moving out but for the time being still remains her main emotional support.  No side effects from the Prozac that was started no new medical issues.  No thoughts of suicide.  This is her first attempt. Patient states that she is socializing more and acclimating to the unit.  Says that she is a shy person to begin with.  Wonders when her discharge is.  Told her that this will be discussed with her by the primary team  Principal Problem: Severe recurrent major depression without psychotic features (HCC) Diagnosis: Principal Problem:   Severe recurrent major depression without psychotic features (HCC)  Total Time spent with patient: 30 minutes   Past Medical History:  Past Medical History:  Diagnosis Date  . History of bladder problems   . Migraines     Past Surgical History:  Procedure Laterality Date  . UMBILICAL HERNIA REPAIR     age 27   Family History:   Social History:  Social History   Substance and Sexual Activity  Alcohol Use Yes   Comment: rarely     Social History    Substance and Sexual Activity  Drug Use No    Social History   Socioeconomic History  . Marital status: Married    Spouse name: Not on file  . Number of children: Not on file  . Years of education: Not on file  . Highest education level: Not on file  Occupational History  . Not on file  Tobacco Use  . Smoking status: Never Smoker  . Smokeless tobacco: Never Used  Substance and Sexual Activity  . Alcohol use: Yes    Comment: rarely  . Drug use: No  . Sexual activity: Not on file  Other Topics Concern  . Not on file  Social History Narrative  . Not on file   Social Determinants of Health   Financial Resource Strain:   . Difficulty of Paying Living Expenses: Not on file  Food Insecurity:   . Worried About Programme researcher, broadcasting/film/video in the Last Year: Not on file  . Ran Out of Food in the Last Year: Not on file  Transportation Needs:   . Lack of Transportation (Medical): Not on file  . Lack of Transportation (Non-Medical): Not on file  Physical Activity:   . Days of Exercise per Week: Not on file  . Minutes of Exercise per Session: Not on file  Stress:   .  Feeling of Stress : Not on file  Social Connections:   . Frequency of Communication with Friends and Family: Not on file  . Frequency of Social Gatherings with Friends and Family: Not on file  . Attends Religious Services: Not on file  . Active Member of Clubs or Organizations: Not on file  . Attends Banker Meetings: Not on file  . Marital Status: Not on file   Additional Social History:    Pain Medications: see PTA Prescriptions: see PTA Over the Counter: see PTA                    Sleep: Good  Appetite:  Good  Current Medications: Current Facility-Administered Medications  Medication Dose Route Frequency Provider Last Rate Last Admin  . acetaminophen (TYLENOL) tablet 650 mg  650 mg Oral Q6H PRN Clapacs, Jackquline Denmark, MD   650 mg at 01/02/20 1228  . alum & mag hydroxide-simeth  (MAALOX/MYLANTA) 200-200-20 MG/5ML suspension 30 mL  30 mL Oral Q4H PRN Clapacs, John T, MD      . ARIPiprazole (ABILIFY) tablet 10 mg  10 mg Oral Daily Jesse Sans, MD   10 mg at 01/04/20 7062  . darifenacin (ENABLEX) 24 hr tablet 15 mg  15 mg Oral Daily Jesse Sans, MD   15 mg at 01/04/20 3762  . FLUoxetine (PROZAC) capsule 20 mg  20 mg Oral Daily Jesse Sans, MD   20 mg at 01/04/20 8315  . hydrOXYzine (ATARAX/VISTARIL) tablet 50 mg  50 mg Oral TID PRN Clapacs, Jackquline Denmark, MD   50 mg at 01/03/20 2130  . magnesium hydroxide (MILK OF MAGNESIA) suspension 30 mL  30 mL Oral Daily PRN Clapacs, John T, MD      . propranolol (INDERAL) tablet 40 mg  40 mg Oral Daily Jesse Sans, MD   40 mg at 01/04/20 1761  . traZODone (DESYREL) tablet 100 mg  100 mg Oral QHS PRN Clapacs, Jackquline Denmark, MD   100 mg at 01/03/20 2130    Lab Results:  Results for orders placed or performed during the hospital encounter of 01/01/20 (from the past 48 hour(s))  Lipid panel     Status: Abnormal   Collection Time: 01/03/20  8:26 AM  Result Value Ref Range   Cholesterol 192 0 - 200 mg/dL   Triglycerides 95 <607 mg/dL   HDL 44 >37 mg/dL   Total CHOL/HDL Ratio 4.4 RATIO   VLDL 19 0 - 40 mg/dL   LDL Cholesterol 106 (H) 0 - 99 mg/dL    Comment:        Total Cholesterol/HDL:CHD Risk Coronary Heart Disease Risk Table                     Men   Women  1/2 Average Risk   3.4   3.3  Average Risk       5.0   4.4  2 X Average Risk   9.6   7.1  3 X Average Risk  23.4   11.0        Use the calculated Patient Ratio above and the CHD Risk Table to determine the patient's CHD Risk.        ATP III CLASSIFICATION (LDL):  <100     mg/dL   Optimal  269-485  mg/dL   Near or Above  Optimal  130-159  mg/dL   Borderline  161-096  mg/dL   High  >045     mg/dL   Very High Performed at [facility], [address] [place], [place] [address]     Blood Alcohol level:  Lab Results  Component  Value Date   ETH 62 (H) 01/01/2020    Metabolic Disorder Labs: No results found for: HGBA1C, MPG No results found for: PROLACTIN Lab Results  Component Value Date   CHOL 192 01/03/2020   TRIG 95 01/03/2020   HDL 44 01/03/2020   CHOLHDL 4.4 01/03/2020   VLDL 19 01/03/2020   LDLCALC 129 (H) 01/03/2020    Physical Findings: AIMS:  , ,  ,  ,    CIWA:    COWS:     Musculoskeletal: Strength & Muscle Tone: within normal limits Gait & Station: normal Patient leans: Right  Psychiatric Specialty Exam: Physical Exam  Review of Systems  Blood pressure 127/81, pulse 92, temperature 97.9 F (36.6 C), temperature source Oral, resp. rate 16, height 6' (1.829 m), weight 107 kg, SpO2 100 %.Body mass index is 32.01 kg/m.   General Appearance:Fairly Groomed  Eye Contact:good  Speech:Normal Rate  Volume:Normal  Mood:Good  Affect:neutral  Thought Process:Coherent  Orientation:Full (Time, Place, and Person)  Thought Content:Logical  Suicidal Thoughts:Yes.without intent/plan  Homicidal Thoughts:No  Memory:Immediate;Fair Recent;Fair Remote;Fair  Judgement:improving, fair to good  Insight:Fair  Psychomotor Activity:Normal  Concentration:Concentration:Goodand Attention Span: Good  Recall:Good  Fund of Knowledge:Good  Language:Good  Akathisia:Negative  Handed:Right  AIMS (if indicated):   Assets:Communication Skills Desire for Improvement Financial Resources/Insurance Housing Social Support Transportation Vocational/Educational  ADL's:Intact  Cognition:WNL  Sleep:      Treatment Plan Summary: Daily contact with patient to assess and evaluate symptoms and progress in treatment and Medication management Plan: Continue inpatient admission at this time, and convert to voluntary admission. Start Prozac 20 mg daily. Will continue Abilify 10 mg at this time as it was modestly helpful in the past. Resume home medications.  Continue to monitor blood pressure. Patient typically normotensive at 122/68.   01/03/20 Lipid panel reviewed, LDL 129.  Recommend diet control Pending globin A1c No changes No symptoms of any EPS or NMS  01/04/2020 No changes  Reggie Pile, MD 01/04/2020, 8:29 AM

## 2020-01-04 NOTE — Plan of Care (Signed)
°  Problem: Education: Goal: Knowledge of [facility] Education information/materials will improve Outcome: Progressing Goal: Emotional status will improve Outcome: Progressing Goal: Mental status will improve Outcome: Progressing Goal: Verbalization of understanding the information provided will improve Outcome: Progressing   Problem: Safety: Goal: Periods of time without injury will increase Outcome: Progressing   Problem: Coping: Goal: Coping ability will improve Outcome: Progressing Goal: Will verbalize feelings Outcome: Progressing   Problem: Self-Concept: Goal: Level of anxiety will decrease Outcome: Progressing   Problem: Coping: Goal: Coping ability will improve Outcome: Progressing   Problem: Self-Concept: Goal: Ability to disclose and discuss suicidal ideas will improve Outcome: Progressing   

## 2020-01-05 LAB — HEMOGLOBIN A1C
Hgb A1c MFr Bld: 5.4 % (ref 4.8–5.6)
Mean Plasma Glucose: 108 mg/dL

## 2020-01-05 MED ORDER — PROPRANOLOL HCL 40 MG PO TABS: 40.0000 mg | ORAL_TABLET | Freq: Every day | ORAL | 1 refills | Status: DC

## 2020-01-05 MED ORDER — DARIFENACIN HYDROBROMIDE ER 15 MG PO TB24: 15.0000 mg | ORAL_TABLET | Freq: Every day | ORAL | 1 refills | Status: DC

## 2020-01-05 MED ORDER — TRAZODONE HCL 100 MG PO TABS: 100.0000 mg | ORAL_TABLET | Freq: Every evening | ORAL | 1 refills | Status: DC | PRN

## 2020-01-05 MED ORDER — FLUOXETINE HCL 20 MG PO CAPS: 20.0000 mg | ORAL_CAPSULE | Freq: Every day | ORAL | 3 refills | Status: DC

## 2020-01-05 MED ORDER — ARIPIPRAZOLE 10 MG PO TABS: 10.0000 mg | ORAL_TABLET | Freq: Every day | ORAL | 1 refills | Status: DC

## 2020-01-05 NOTE — Progress Notes (Signed)
Patient is alert and oriented. She is pleasant and easy to engage.  She is med compliant and tolerated her meds without incident. She denied SI  HI AVH anxiety and pain at this encounter.  She continues to endorse having some depression, but states that it is minimal and she is "ok and coping well" She stated that she is so much better and ready to leave. She was provided support and encouragement and encouraged to contact staff with any concerns. She remains safe with 25 minute safety checks.    Cleo Butler-Nicholson, LPN

## 2020-01-05 NOTE — Progress Notes (Signed)
Recreation Therapy Notes  Date: 01/05/2020  Time: 9:30 am    Location: Craft room   Behavioral response: Appropriate  Intervention Topic: Relaxation  Discussion/Intervention:  Group content today was focused on relaxation. The group defined relaxation and identified healthy ways to relax. Individuals expressed how much time they spend relaxing. Patients expressed how much their life would be if they did not make time for themselves to relax. The group stated ways they could improve their relaxation techniques in the future.  Individuals participated in the intervention "Time to Relax" where they had a chance to experience different relaxation techniques.   Clinical Observations/Feedback: Patient came to group and identified her relaxation techniques as listening to music. She expressed that stress sometimes stops her from relaxing. Participant stated that meditation is a technique she could try to help her relax her mind. Individual was social with peers and staff while participating in the intervention.  Kendre Jacinto LRT/CTRS         Jayne Peckenpaugh 01/05/2020 11:34 AM

## 2020-01-05 NOTE — Progress Notes (Signed)
Recreation Therapy Notes  INPATIENT RECREATION TR PLAN  Patient Details Name: Ashley Valencia MRN: [id number] DOB: January 05, 1991 Today's Date: 01/05/2020  Rec Therapy Plan Is patient appropriate for Therapeutic Recreation?: Yes Treatment times per week: At least 3 Estimated Length of Stay: 5-7 days TR Treatment/Interventions: Group participation (Comment)  Discharge Criteria Pt will be discharged from therapy if:: Discharged Treatment plan/goals/alternatives discussed and agreed upon by:: Patient/family  Discharge Summary Short term goals set: Patient will successfully identify 2 ways of making healthy decisions post d/c within 5 recreation therapy group sessions Short term goals met: Complete Progress toward goals comments: Groups attended Which groups?: Communication, Other (Comment) (Relaxation) Reason goals not met: N/A Therapeutic equipment acquired: N/A Reason patient discharged from therapy: Discharge from hospital Pt/family agrees with progress & goals achieved: Yes Date patient discharged from therapy: 01/05/20   Devone Tousley 01/05/2020, 12:10 PM

## 2020-01-05 NOTE — [facility] Suicide Risk Assessment (Signed)
[facility] Discharge Suicide Risk Assessment   Principal Problem: Severe recurrent major depression without psychotic features [facility]) Discharge Diagnoses: Principal Problem:   Severe recurrent major depression without psychotic features (HCC)   Total Time spent with patient: 30 minutes  Musculoskeletal: Strength & Muscle Tone: within normal limits Gait & Station: normal Patient leans: N/A  Psychiatric Specialty Exam: Review of Systems  Constitutional: Negative for appetite change and fatigue.  HENT: Negative for rhinorrhea and sore throat.   Eyes: Negative for photophobia and visual disturbance.  Respiratory: Negative for cough and shortness of breath.   Cardiovascular: Negative for chest pain and palpitations.  Gastrointestinal: Negative for constipation, diarrhea, nausea and vomiting.  Endocrine: Negative for cold intolerance and heat intolerance.  Genitourinary: Negative for difficulty urinating and dysuria.  Musculoskeletal: Negative for back pain and myalgias.  Skin: Negative for rash and wound.  Allergic/Immunologic: Negative for environmental allergies and food allergies.  Neurological: Negative for dizziness and headaches.  Hematological: Negative for adenopathy. Does not bruise/bleed easily.  Psychiatric/Behavioral: Negative for dysphoric mood, hallucinations and suicidal ideas. The patient is not nervous/anxious.     Blood pressure 122/77, pulse 74, temperature 98.7 F (37.1 C), temperature source Oral, resp. rate 16, height 6' (1.829 m), weight 107 kg, SpO2 100 %.Body mass index is 32.01 kg/m.  General Appearance: Well Groomed  Patent attorney::  Good  Speech:  Clear and Coherent409  Volume:  Normal  Mood:  Euthymic  Affect:  Appropriate, Congruent and brighter  Thought Process:  Coherent  Orientation:  Full (Time, Place, and Person)  Thought Content:  Logical  Suicidal Thoughts:  No  Homicidal Thoughts:  No  Memory:  Immediate;   Good Recent;   Good Remote;   Good   Judgement:  Good  Insight:  Good  Psychomotor Activity:  Normal  Concentration:  Good  Recall:  Good  Fund of Knowledge:Good  Language: Good  Akathisia:  Negative  Handed:  Right  AIMS (if indicated):     Assets:  Communication Skills Desire for Improvement Financial Resources/Insurance Housing Physical Health Resilience Social Support Talents/Skills Transportation Vocational/Educational  Sleep:  Number of Hours: 6  Cognition: WNL  ADL's:  Intact   Mental Status Per Nursing Assessment::   On Admission:  NA  Demographic Factors:  Divorced or widowed  Loss Factors: Loss of significant relationship  Historical Factors: NA  Risk Reduction Factors:   Sense of responsibility to family, Employed, Positive social support, Positive therapeutic relationship and Positive coping skills or problem solving skills  Continued Clinical Symptoms:  Depression:   Recent sense of peace/wellbeing  Cognitive Features That Contribute To Risk:  None    Suicide Risk:  Minimal: No identifiable suicidal ideation.  Patients presenting with no risk factors but with morbid ruminations; may be classified as minimal risk based on the severity of the depressive symptoms    Plan Of Care/Follow-up recommendations:  Activity:  as tolerated Diet:  regular diet  Jesse Sans, MD 01/05/2020, 9:35 AM

## 2020-01-05 NOTE — Progress Notes (Signed)
  [facility] Adult Case Management Discharge Plan :  Will you be returning to the same living situation after discharge:  Yes,  pt reports that she is returning home. At discharge, do you have transportation home?: Yes,  pt reports a peer will provide transportation. Do you have the ability to pay for your medications: Yes,  [employer]  Release of information consent forms completed and in the chart;  Patient's signature needed at discharge.  Patient to Follow up at:  Follow-up Information    [facility].   Why: Therapy appointment is scheduled 01/08/2020 at 10:30AM.  Medication management appointment is scheduled 01/20/2020 at 1PM.  BOTH are Zoom, you will receive an email 24hrs prior to appointments with instructions.  Email with admission paperwork ASAP. Contact information: [address] [place] [address] [phone]               Next level of care provider has access to [facility] Link:no  Safety Planning and Suicide Prevention discussed: Yes,  SPE completed with the patient.  Have you used any form of tobacco in the last 30 days? (Cigarettes, Smokeless Tobacco, Cigars, and/or Pipes): No  Has patient been referred to the Quitline?: Patient refused referral  Patient has been referred for addiction treatment: Pt. refused referral  Harden Mo, LCSW 01/05/2020, 10:00 AM

## 2020-01-05 NOTE — Progress Notes (Signed)
Patient denies SI/HI, denies A/V hallucinations. Patient verbalizes understanding of discharge instructions, follow up care and prescriptions. Patient given all belongings from [facility] locker. Patient escorted out by staff, transported by friend. 

## 2020-01-05 NOTE — Discharge Summary (Signed)
Physician Discharge Summary Note  Patient:  Ashley Valencia is an 29 y.o., female MRN:  [id number] DOB:  [birth year] Patient phone:  [phone] (home)  Patient address:   [address] [place] [facility] [address],  Total Time spent with patient: 30 minutes  Date of Admission:  01/01/2020 Date of Discharge: 01/05/2020  Reason for Admission:  Suicide attempt via Klonopin overdose, worsening depression  Principal Problem: Severe recurrent major depression without psychotic features [facility]) Discharge Diagnoses: Principal Problem:   Severe recurrent major depression without psychotic features [facility])   Past Psychiatric History: Patient treated via PCP for depression with Abilify, Zoloft, and amitriptyline in the past. No previous inpatient admission. Suicide attempt prior to admission via Klonopin overdose. No other prior suicide attempts.   Past Medical History:  Past Medical History:  Diagnosis Date  . History of bladder problems   . Migraines     Past Surgical History:  Procedure Laterality Date  . UMBILICAL HERNIA REPAIR     age 72   Family History:  Family History  Problem Relation Age of Onset  . Diabetes Mother   . Hypertension Mother   . Kidney failure Mother   . Diabetes Maternal Grandmother   . Hypertension Maternal Grandmother   . Kidney failure Maternal Grandmother   . Diabetes Maternal Grandfather   . Hypertension Maternal Grandfather    Family Psychiatric  History: Father with schizoaffective disorder, bipolar type Social History:  Social History   Substance and Sexual Activity  Alcohol Use Yes   Comment: rarely     Social History   Substance and Sexual Activity  Drug Use No    Social History   Socioeconomic History  . Marital status: Married    Spouse name: Not on file  . Number of children: Not on file  . Years of education: Not on file  . Highest education level: Not on file  Occupational History  . Not on file  Tobacco Use  . Smoking  status: Never Smoker  . Smokeless tobacco: Never Used  Substance and Sexual Activity  . Alcohol use: Yes    Comment: rarely  . Drug use: No  . Sexual activity: Not on file  Other Topics Concern  . Not on file  Social History Narrative  . Not on file   Social Determinants of Health   Financial Resource Strain:   . Difficulty of Paying Living Expenses: Not on file  Food Insecurity:   . Worried About Programme researcher, broadcasting/film/video in the Last Year: Not on file  . Ran Out of Food in the Last Year: Not on file  Transportation Needs:   . Lack of Transportation (Medical): Not on file  . Lack of Transportation (Non-Medical): Not on file  Physical Activity:   . Days of Exercise per Week: Not on file  . Minutes of Exercise per Session: Not on file  Stress:   . Feeling of Stress : Not on file  Social Connections:   . Frequency of Communication with Friends and Family: Not on file  . Frequency of Social Gatherings with Friends and Family: Not on file  . Attends Religious Services: Not on file  . Active Member of Clubs or Organizations: Not on file  . Attends Banker Meetings: Not on file  . Marital Status: Not on file    Hospital Course:  Patient admitted after suicide attempt via Klonopin overdose. She states that she was feeling increasingly depressed after her second marriage had failed.  She states she impulsively took Klonopin in a suicide attempt. She was relieved to wake up from this overdose, and regrets the attempt. She states she never wants to attempt suicide again. She notes that she had been on Abilify 10 mg daily for roughly a year which she felt had been helpful, but effects had waned. She reported low energy, over sleeping, anhedonia, depressed mood, and poor appetite. Klonopin was discontinued in light of overdose. Abilify was continued, and Prozac 20 mg was started. She reports that she feels her energy has improved during the day. She feels less anxious and less depressed,  and is sleeping well. She did not require any PRNs for anxiety. Since admission she denied suicidal ideations, homicidal ideations, visual hallucinations, and auditory hallucinations. Mood gradually improved, and affect became brighter. She participated well in groups, and interacted well with peers and staff. At time of discharge patient and treatment team felt she was safe to discharge home with close outpatient follow-up.   Physical Findings: AIMS: Facial and Oral Movements Muscles of Facial Expression: None, normal Lips and Perioral Area: None, normal Jaw: None, normal Tongue: None, normal,Extremity Movements Upper (arms, wrists, hands, fingers): None, normal Lower (legs, knees, ankles, toes): None, normal, Trunk Movements Neck, shoulders, hips: None, normal, Overall Severity Severity of abnormal movements (highest score from questions above): None, normal Incapacitation due to abnormal movements: None, normal Patient's awareness of abnormal movements (rate only patient's report): No Awareness, Dental Status Current problems with teeth and/or dentures?: No Does patient usually wear dentures?: No  CIWA:    COWS:     Musculoskeletal: Strength & Muscle Tone: within normal limits Gait & Station: normal Patient leans: N/A  Psychiatric Specialty Exam: Physical Exam Vitals and nursing note reviewed.  Constitutional:      Appearance: Normal appearance.  HENT:     Head: Normocephalic and atraumatic.     Right Ear: External ear normal.     Left Ear: External ear normal.     Nose: Nose normal.     Mouth/Throat:     Mouth: Mucous membranes are moist.     Pharynx: Oropharynx is clear.  Eyes:     Extraocular Movements: Extraocular movements intact.     Conjunctiva/sclera: Conjunctivae normal.     Pupils: Pupils are equal, round, and reactive to light.  Cardiovascular:     Rate and Rhythm: Normal rate.     Pulses: Normal pulses.  Pulmonary:     Effort: Pulmonary effort is normal.      Breath sounds: Normal breath sounds.  Abdominal:     General: Abdomen is flat.     Palpations: Abdomen is soft.  Musculoskeletal:        General: No swelling. Normal range of motion.     Cervical back: Normal range of motion and neck supple.  Skin:    General: Skin is warm and dry.  Neurological:     General: No focal deficit present.     Mental Status: She is alert and oriented to person, place, and time.  Psychiatric:        Mood and Affect: Mood normal.        Behavior: Behavior normal.        Thought Content: Thought content normal.        Judgment: Judgment normal.     Review of Systems  Constitutional: Negative for appetite change and fatigue.  HENT: Negative for rhinorrhea and sore throat.   Eyes: Negative for photophobia and visual disturbance.  Respiratory: Negative for cough and shortness of breath.   Cardiovascular: Negative for chest pain and palpitations.  Gastrointestinal: Negative for constipation, diarrhea, nausea and vomiting.  Endocrine: Negative for cold intolerance and heat intolerance.  Genitourinary: Negative for difficulty urinating and dysuria.  Musculoskeletal: Negative for back pain and myalgias.  Skin: Negative for rash and wound.  Allergic/Immunologic: Negative for environmental allergies and food allergies.  Neurological: Negative for dizziness and headaches.  Hematological: Negative for adenopathy. Does not bruise/bleed easily.  Psychiatric/Behavioral: Negative for dysphoric mood, hallucinations and suicidal ideas. The patient is not nervous/anxious.     Blood pressure 122/77, pulse 74, temperature 98.7 F (37.1 C), temperature source Oral, resp. rate 16, height 6' (1.829 m), weight 107 kg, SpO2 100 %.Body mass index is 32.01 kg/m.  General Appearance: Well Groomed  Eye Contact::  Good  Speech:  Clear and Coherent409  Volume:  Normal  Mood:  Euthymic  Affect:  Appropriate, Congruent and brighter  Thought Process:  Coherent  Orientation:   Full (Time, Place, and Person)  Thought Content:  Logical  Suicidal Thoughts:  No  Homicidal Thoughts:  No  Memory:  Immediate;   Good Recent;   Good Remote;   Good  Judgement:  Good  Insight:  Good  Psychomotor Activity:  Normal  Concentration:  Good  Recall:  Good  Fund of Knowledge:Good  Language: Good  Akathisia:  Negative  Handed:  Right  AIMS (if indicated):     Assets:  Communication Skills Desire for Improvement Financial Resources/Insurance Housing Physical Health Resilience Social Support Talents/Skills Transportation Vocational/Educational  Sleep:  Number of Hours: 6  Cognition: WNL  ADL's:  Intact         Have you used any form of tobacco in the last 30 days? (Cigarettes, Smokeless Tobacco, Cigars, and/or Pipes): No  Has this patient used any form of tobacco in the last 30 days? (Cigarettes, Smokeless Tobacco, Cigars, and/or Pipes)  No  Blood Alcohol level:  Lab Results  Component Value Date   ETH 62 (H) 01/01/2020    Metabolic Disorder Labs:  No results found for: HGBA1C, MPG No results found for: PROLACTIN Lab Results  Component Value Date   CHOL 192 01/03/2020   TRIG 95 01/03/2020   HDL 44 01/03/2020   CHOLHDL 4.4 01/03/2020   VLDL 19 01/03/2020   LDLCALC 129 (H) 01/03/2020    See Psychiatric Specialty Exam and Suicide Risk Assessment completed by Attending Physician prior to discharge.  Discharge destination:  Home  Is patient on multiple antipsychotic therapies at discharge:  No   Has Patient had three or more failed trials of antipsychotic monotherapy by history:  No  Recommended Plan for Multiple Antipsychotic Therapies: NA  Discharge Instructions    Diet general   Complete by: As directed    Increase activity slowly   Complete by: As directed    Increase activity slowly   Complete by: As directed      Allergies as of 01/05/2020   No Known Allergies     Medication List    STOP taking these medications    methylPREDNISolone 4 MG tablet Commonly known as: Medrol   solifenacin 10 MG tablet Commonly known as: VESICARE Replaced by: darifenacin 15 MG 24 hr tablet   traMADol 50 MG tablet Commonly known as: ULTRAM     TAKE these medications     Indication  ARIPiprazole 10 MG tablet Commonly known as: ABILIFY Take 1 tablet (10 mg total) by mouth daily.  Indication:  Major Depressive Disorder   darifenacin 15 MG 24 hr tablet Commonly known as: ENABLEX Take 1 tablet (15 mg total) by mouth daily. Start taking on: January 06, 2020 Replaces: solifenacin 10 MG tablet  Indication: Overactive Bladder   Emgality 120 MG/ML Soaj Generic drug: Galcanezumab-gnlm Inject 1 mL into the skin every 30 (thirty) days.  Indication: Migraine Headache   FLUoxetine 20 MG capsule Commonly known as: PROZAC Take 1 capsule (20 mg total) by mouth daily. Start taking on: January 06, 2020  Indication: Major Depressive Disorder   propranolol 40 MG tablet Commonly known as: INDERAL Take 1 tablet (40 mg total) by mouth daily. Start taking on: January 06, 2020  Indication: Migraine Headache   traZODone 100 MG tablet Commonly known as: DESYREL Take 1 tablet (100 mg total) by mouth at bedtime as needed for sleep.  Indication: Major Depressive Disorder       Follow-up Information    [facility].   Why: Therapy appointment is scheduled 01/08/2020 at 10:30AM.  Medication management appointment is scheduled 01/20/2020 at 1PM.  BOTH are Zoom, you will receive an email 24hrs prior to appointments with instructions.  Email with admission paperwork ASAP. Contact information: [address] [place] [address] [phone]               Follow-up recommendations:  Activity:  as tolerated Diet:  regular diet  Comments:  30-day prescriptions with one refill provided to patient via paper scripts.   Signed: Jesse Sans, MD 01/05/2020, 11:35 AM

## 2020-01-08 DIAGNOSIS — F411 Generalized anxiety disorder: Secondary | ICD-10-CM | POA: Diagnosis not present

## 2020-01-08 DIAGNOSIS — F329 Major depressive disorder, single episode, unspecified: Secondary | ICD-10-CM | POA: Diagnosis not present

## 2020-01-08 DIAGNOSIS — F1010 Alcohol abuse, uncomplicated: Secondary | ICD-10-CM | POA: Diagnosis not present

## 2020-01-09 DIAGNOSIS — F411 Generalized anxiety disorder: Secondary | ICD-10-CM | POA: Diagnosis not present

## 2020-01-09 DIAGNOSIS — F3341 Major depressive disorder, recurrent, in partial remission: Secondary | ICD-10-CM | POA: Diagnosis not present

## 2020-01-19 DIAGNOSIS — F329 Major depressive disorder, single episode, unspecified: Secondary | ICD-10-CM | POA: Diagnosis not present

## 2020-01-19 DIAGNOSIS — F1010 Alcohol abuse, uncomplicated: Secondary | ICD-10-CM | POA: Diagnosis not present

## 2020-01-19 DIAGNOSIS — F411 Generalized anxiety disorder: Secondary | ICD-10-CM | POA: Diagnosis not present

## 2020-01-28 DIAGNOSIS — F1010 Alcohol abuse, uncomplicated: Secondary | ICD-10-CM | POA: Diagnosis not present

## 2020-01-28 DIAGNOSIS — F329 Major depressive disorder, single episode, unspecified: Secondary | ICD-10-CM | POA: Diagnosis not present

## 2020-01-28 DIAGNOSIS — F411 Generalized anxiety disorder: Secondary | ICD-10-CM | POA: Diagnosis not present

## 2020-02-06 DIAGNOSIS — F411 Generalized anxiety disorder: Secondary | ICD-10-CM | POA: Diagnosis not present

## 2020-02-06 DIAGNOSIS — F329 Major depressive disorder, single episode, unspecified: Secondary | ICD-10-CM | POA: Diagnosis not present

## 2020-02-06 DIAGNOSIS — F1010 Alcohol abuse, uncomplicated: Secondary | ICD-10-CM | POA: Diagnosis not present

## 2020-03-09 DIAGNOSIS — L255 Unspecified contact dermatitis due to plants, except food: Secondary | ICD-10-CM | POA: Diagnosis not present

## 2020-03-22 DIAGNOSIS — Z1152 Encounter for screening for COVID-19: Secondary | ICD-10-CM | POA: Diagnosis not present

## 2020-03-24 DIAGNOSIS — R21 Rash and other nonspecific skin eruption: Secondary | ICD-10-CM | POA: Diagnosis not present

## 2020-04-15 DIAGNOSIS — F3341 Major depressive disorder, recurrent, in partial remission: Secondary | ICD-10-CM | POA: Diagnosis not present

## 2020-05-29 ENCOUNTER — Inpatient Hospital Stay (HOSPITAL_COMMUNITY)
Admission: EM | Admit: 2020-05-29 | Discharge: 2020-06-03 | DRG: 917 | Disposition: A | Discharge status: Rehabilitation Facility | Payer: BC Managed Care – PPO | Attending: Internal Medicine | Admitting: Internal Medicine

## 2020-05-29 ENCOUNTER — Encounter (HOSPITAL_COMMUNITY): Payer: Self-pay

## 2020-05-29 ENCOUNTER — Other Ambulatory Visit: Payer: Self-pay

## 2020-05-29 DIAGNOSIS — R441 Visual hallucinations: Secondary | ICD-10-CM | POA: Diagnosis not present

## 2020-05-29 DIAGNOSIS — J9601 Acute respiratory failure with hypoxia: Secondary | ICD-10-CM | POA: Diagnosis present

## 2020-05-29 DIAGNOSIS — T471X2A Poisoning by other antacids and anti-gastric-secretion drugs, intentional self-harm, initial encounter: Secondary | ICD-10-CM | POA: Diagnosis present

## 2020-05-29 DIAGNOSIS — T50902A Poisoning by unspecified drugs, medicaments and biological substances, intentional self-harm, initial encounter: Secondary | ICD-10-CM | POA: Diagnosis not present

## 2020-05-29 DIAGNOSIS — T510X2A Toxic effect of ethanol, intentional self-harm, initial encounter: Secondary | ICD-10-CM | POA: Diagnosis present

## 2020-05-29 DIAGNOSIS — F332 Major depressive disorder, recurrent severe without psychotic features: Secondary | ICD-10-CM | POA: Diagnosis present

## 2020-05-29 DIAGNOSIS — Z841 Family history of disorders of kidney and ureter: Secondary | ICD-10-CM

## 2020-05-29 DIAGNOSIS — F32A Depression, unspecified: Secondary | ICD-10-CM | POA: Diagnosis not present

## 2020-05-29 DIAGNOSIS — Z9151 Personal history of suicidal behavior: Secondary | ICD-10-CM

## 2020-05-29 DIAGNOSIS — Y92009 Unspecified place in unspecified non-institutional (private) residence as the place of occurrence of the external cause: Secondary | ICD-10-CM

## 2020-05-29 DIAGNOSIS — S51812A Laceration without foreign body of left forearm, initial encounter: Secondary | ICD-10-CM | POA: Diagnosis present

## 2020-05-29 DIAGNOSIS — T50901A Poisoning by unspecified drugs, medicaments and biological substances, accidental (unintentional), initial encounter: Secondary | ICD-10-CM | POA: Diagnosis present

## 2020-05-29 DIAGNOSIS — T424X2A Poisoning by benzodiazepines, intentional self-harm, initial encounter: Principal | ICD-10-CM | POA: Diagnosis present

## 2020-05-29 DIAGNOSIS — Z20822 Contact with and (suspected) exposure to covid-19: Secondary | ICD-10-CM | POA: Diagnosis present

## 2020-05-29 DIAGNOSIS — G210 Malignant neuroleptic syndrome: Secondary | ICD-10-CM | POA: Diagnosis present

## 2020-05-29 DIAGNOSIS — Z79899 Other long term (current) drug therapy: Secondary | ICD-10-CM

## 2020-05-29 DIAGNOSIS — T887XXA Unspecified adverse effect of drug or medicament, initial encounter: Secondary | ICD-10-CM | POA: Diagnosis not present

## 2020-05-29 DIAGNOSIS — I959 Hypotension, unspecified: Secondary | ICD-10-CM | POA: Diagnosis not present

## 2020-05-29 DIAGNOSIS — G43909 Migraine, unspecified, not intractable, without status migrainosus: Secondary | ICD-10-CM | POA: Diagnosis present

## 2020-05-29 DIAGNOSIS — Z818 Family history of other mental and behavioral disorders: Secondary | ICD-10-CM

## 2020-05-29 DIAGNOSIS — G928 Other toxic encephalopathy: Secondary | ICD-10-CM | POA: Diagnosis present

## 2020-05-29 DIAGNOSIS — Z833 Family history of diabetes mellitus: Secondary | ICD-10-CM

## 2020-05-29 DIAGNOSIS — Z8249 Family history of ischemic heart disease and other diseases of the circulatory system: Secondary | ICD-10-CM

## 2020-05-29 DIAGNOSIS — E876 Hypokalemia: Secondary | ICD-10-CM | POA: Diagnosis not present

## 2020-05-29 DIAGNOSIS — R0902 Hypoxemia: Secondary | ICD-10-CM

## 2020-05-29 DIAGNOSIS — R9431 Abnormal electrocardiogram [ECG] [EKG]: Secondary | ICD-10-CM | POA: Diagnosis not present

## 2020-05-29 DIAGNOSIS — T443X2A Poisoning by other parasympatholytics [anticholinergics and antimuscarinics] and spasmolytics, intentional self-harm, initial encounter: Secondary | ICD-10-CM | POA: Diagnosis present

## 2020-05-29 DIAGNOSIS — I10 Essential (primary) hypertension: Secondary | ICD-10-CM | POA: Diagnosis not present

## 2020-05-29 DIAGNOSIS — J690 Pneumonitis due to inhalation of food and vomit: Secondary | ICD-10-CM | POA: Diagnosis present

## 2020-05-29 LAB — CBC
HCT: 42.8 % (ref 36.0–46.0)
Hemoglobin: 14.1 g/dL (ref 12.0–15.0)
MCH: 27.1 pg (ref 26.0–34.0)
MCHC: 32.9 g/dL (ref 30.0–36.0)
MCV: 82.1 fL (ref 80.0–100.0)
Platelets: 205 10*3/uL (ref 150–400)
RBC: 5.21 MIL/uL — ABNORMAL HIGH (ref 3.87–5.11)
RDW: 13.2 % (ref 11.5–15.5)
WBC: 7.4 10*3/uL (ref 4.0–10.5)
nRBC: 0 % (ref 0.0–0.2)

## 2020-05-29 LAB — I-STAT BETA HCG BLOOD, ED (MC, WL, AP ONLY): I-stat hCG, quantitative: <5 m[IU]/mL (ref <5)

## 2020-05-29 NOTE — ED Triage Notes (Signed)
Patient from home, attempted suicide tonight by overdose on pills related to impending divorce. Unknown amount of pills but the patient did fill 90 day supply last week.   EMS Vitals  120/70 100 HR 98% RA 18 RR 124 CBG 20 R A/C   12-0.5mg  Xanax Pantoprazole 40 mg  Fluvoxamine 50 mg  Solifenacin 10 mg Abilify 10 mg

## 2020-05-30 ENCOUNTER — Observation Stay (HOSPITAL_COMMUNITY): Payer: BC Managed Care – PPO

## 2020-05-30 DIAGNOSIS — G43909 Migraine, unspecified, not intractable, without status migrainosus: Secondary | ICD-10-CM | POA: Diagnosis not present

## 2020-05-30 DIAGNOSIS — Z9151 Personal history of suicidal behavior: Secondary | ICD-10-CM | POA: Diagnosis not present

## 2020-05-30 DIAGNOSIS — G43911 Migraine, unspecified, intractable, with status migrainosus: Secondary | ICD-10-CM | POA: Diagnosis not present

## 2020-05-30 DIAGNOSIS — J9601 Acute respiratory failure with hypoxia: Secondary | ICD-10-CM | POA: Diagnosis not present

## 2020-05-30 DIAGNOSIS — T50901A Poisoning by unspecified drugs, medicaments and biological substances, accidental (unintentional), initial encounter: Secondary | ICD-10-CM | POA: Diagnosis present

## 2020-05-30 DIAGNOSIS — N3281 Overactive bladder: Secondary | ICD-10-CM | POA: Diagnosis not present

## 2020-05-30 DIAGNOSIS — T50902A Poisoning by unspecified drugs, medicaments and biological substances, intentional self-harm, initial encounter: Secondary | ICD-10-CM | POA: Diagnosis present

## 2020-05-30 DIAGNOSIS — G210 Malignant neuroleptic syndrome: Secondary | ICD-10-CM | POA: Diagnosis not present

## 2020-05-30 DIAGNOSIS — T443X2A Poisoning by other parasympatholytics [anticholinergics and antimuscarinics] and spasmolytics, intentional self-harm, initial encounter: Secondary | ICD-10-CM | POA: Diagnosis not present

## 2020-05-30 DIAGNOSIS — J90 Pleural effusion, not elsewhere classified: Secondary | ICD-10-CM | POA: Diagnosis not present

## 2020-05-30 DIAGNOSIS — R0902 Hypoxemia: Secondary | ICD-10-CM | POA: Diagnosis not present

## 2020-05-30 DIAGNOSIS — E876 Hypokalemia: Secondary | ICD-10-CM | POA: Diagnosis not present

## 2020-05-30 DIAGNOSIS — R45851 Suicidal ideations: Secondary | ICD-10-CM | POA: Diagnosis not present

## 2020-05-30 DIAGNOSIS — Z818 Family history of other mental and behavioral disorders: Secondary | ICD-10-CM | POA: Diagnosis not present

## 2020-05-30 DIAGNOSIS — K219 Gastro-esophageal reflux disease without esophagitis: Secondary | ICD-10-CM | POA: Diagnosis not present

## 2020-05-30 DIAGNOSIS — J690 Pneumonitis due to inhalation of food and vomit: Secondary | ICD-10-CM | POA: Diagnosis not present

## 2020-05-30 DIAGNOSIS — F332 Major depressive disorder, recurrent severe without psychotic features: Secondary | ICD-10-CM | POA: Diagnosis not present

## 2020-05-30 DIAGNOSIS — Z79899 Other long term (current) drug therapy: Secondary | ICD-10-CM | POA: Diagnosis not present

## 2020-05-30 DIAGNOSIS — T424X2A Poisoning by benzodiazepines, intentional self-harm, initial encounter: Secondary | ICD-10-CM | POA: Diagnosis not present

## 2020-05-30 DIAGNOSIS — Z833 Family history of diabetes mellitus: Secondary | ICD-10-CM | POA: Diagnosis not present

## 2020-05-30 DIAGNOSIS — Z634 Disappearance and death of family member: Secondary | ICD-10-CM | POA: Diagnosis not present

## 2020-05-30 DIAGNOSIS — Z8249 Family history of ischemic heart disease and other diseases of the circulatory system: Secondary | ICD-10-CM | POA: Diagnosis not present

## 2020-05-30 DIAGNOSIS — T471X2A Poisoning by other antacids and anti-gastric-secretion drugs, intentional self-harm, initial encounter: Secondary | ICD-10-CM | POA: Diagnosis not present

## 2020-05-30 DIAGNOSIS — R441 Visual hallucinations: Secondary | ICD-10-CM | POA: Diagnosis not present

## 2020-05-30 DIAGNOSIS — R9431 Abnormal electrocardiogram [ECG] [EKG]: Secondary | ICD-10-CM | POA: Diagnosis not present

## 2020-05-30 DIAGNOSIS — T510X2A Toxic effect of ethanol, intentional self-harm, initial encounter: Secondary | ICD-10-CM | POA: Diagnosis not present

## 2020-05-30 DIAGNOSIS — F1220 Cannabis dependence, uncomplicated: Secondary | ICD-10-CM | POA: Diagnosis not present

## 2020-05-30 DIAGNOSIS — Z841 Family history of disorders of kidney and ureter: Secondary | ICD-10-CM | POA: Diagnosis not present

## 2020-05-30 DIAGNOSIS — G928 Other toxic encephalopathy: Secondary | ICD-10-CM | POA: Diagnosis not present

## 2020-05-30 DIAGNOSIS — F1020 Alcohol dependence, uncomplicated: Secondary | ICD-10-CM | POA: Diagnosis not present

## 2020-05-30 DIAGNOSIS — F32A Depression, unspecified: Secondary | ICD-10-CM | POA: Diagnosis not present

## 2020-05-30 DIAGNOSIS — Y92009 Unspecified place in unspecified non-institutional (private) residence as the place of occurrence of the external cause: Secondary | ICD-10-CM | POA: Diagnosis not present

## 2020-05-30 DIAGNOSIS — F411 Generalized anxiety disorder: Secondary | ICD-10-CM | POA: Diagnosis not present

## 2020-05-30 DIAGNOSIS — Z20822 Contact with and (suspected) exposure to covid-19: Secondary | ICD-10-CM | POA: Diagnosis not present

## 2020-05-30 DIAGNOSIS — S51812A Laceration without foreign body of left forearm, initial encounter: Secondary | ICD-10-CM | POA: Diagnosis not present

## 2020-05-30 LAB — BASIC METABOLIC PANEL
Anion gap: 9 (ref 5–15)
BUN: 9 mg/dL (ref 6–20)
CO2: 23 mmol/L (ref 22–32)
Calcium: 8.8 mg/dL — ABNORMAL LOW (ref 8.9–10.3)
Chloride: 108 mmol/L (ref 98–111)
Creatinine, Ser: 0.82 mg/dL (ref 0.44–1.00)
GFR, Estimated: >60 mL/min (ref >60)
Glucose, Bld: 130 mg/dL — ABNORMAL HIGH (ref 70–99)
Potassium: 3.9 mmol/L (ref 3.5–5.1)
Sodium: 140 mmol/L (ref 135–145)

## 2020-05-30 LAB — RAPID URINE DRUG SCREEN, HOSP PERFORMED
Amphetamines: NOT DETECTED
Barbiturates: NOT DETECTED
Benzodiazepines: POSITIVE — AB
Cocaine: NOT DETECTED
Opiates: NOT DETECTED
Tetrahydrocannabinol: POSITIVE — AB

## 2020-05-30 LAB — POCT I-STAT 7, (LYTES, BLD GAS, ICA,H+H)
Acid-base deficit: 7 mmol/L — ABNORMAL HIGH (ref 0.0–2.0)
Bicarbonate: 17.9 mmol/L — ABNORMAL LOW (ref 20.0–28.0)
Calcium, Ion: 1.26 mmol/L (ref 1.15–1.40)
HCT: 42 % (ref 36.0–46.0)
Hemoglobin: 14.3 g/dL (ref 12.0–15.0)
O2 Saturation: 94 %
Potassium: 3.8 mmol/L (ref 3.5–5.1)
Sodium: 143 mmol/L (ref 135–145)
TCO2: 19 mmol/L — ABNORMAL LOW (ref 22–32)
pCO2 arterial: 32.1 mmHg (ref 32.0–48.0)
pH, Arterial: 7.354 (ref 7.350–7.450)
pO2, Arterial: 75 mmHg — ABNORMAL LOW (ref 83.0–108.0)

## 2020-05-30 LAB — HIV ANTIBODY (ROUTINE TESTING W REFLEX): HIV Screen 4th Generation wRfx: NONREACTIVE

## 2020-05-30 LAB — COMPREHENSIVE METABOLIC PANEL
ALT: 34 U/L (ref 0–44)
AST: 31 U/L (ref 15–41)
Albumin: 4.2 g/dL (ref 3.5–5.0)
Alkaline Phosphatase: 43 U/L (ref 38–126)
Anion gap: 10 (ref 5–15)
BUN: 10 mg/dL (ref 6–20)
CO2: 19 mmol/L — ABNORMAL LOW (ref 22–32)
Calcium: 9.2 mg/dL (ref 8.9–10.3)
Chloride: 108 mmol/L (ref 98–111)
Creatinine, Ser: 0.72 mg/dL (ref 0.44–1.00)
GFR, Estimated: >60 mL/min (ref >60)
Glucose, Bld: 127 mg/dL — ABNORMAL HIGH (ref 70–99)
Potassium: 3.4 mmol/L — ABNORMAL LOW (ref 3.5–5.1)
Sodium: 137 mmol/L (ref 135–145)
Total Bilirubin: 1 mg/dL (ref 0.3–1.2)
Total Protein: 7 g/dL (ref 6.5–8.1)

## 2020-05-30 LAB — CBG MONITORING, ED: Glucose-Capillary: 87 mg/dL (ref 70–99)

## 2020-05-30 LAB — PROCALCITONIN: Procalcitonin: <0.1 ng/mL

## 2020-05-30 LAB — SALICYLATE LEVEL
Salicylate Lvl: <7 mg/dL — ABNORMAL LOW (ref 7.0–30.0)
Salicylate Lvl: <7 mg/dL — ABNORMAL LOW (ref 7.0–30.0)

## 2020-05-30 LAB — ACETAMINOPHEN LEVEL
Acetaminophen (Tylenol), Serum: <10 ug/mL — ABNORMAL LOW (ref 10–30)
Acetaminophen (Tylenol), Serum: <10 ug/mL — ABNORMAL LOW (ref 10–30)

## 2020-05-30 LAB — LACTIC ACID, PLASMA
Lactic Acid, Venous: 5.1 mmol/L (ref 0.5–1.9)
Lactic Acid, Venous: 2.8 mmol/L (ref 0.5–1.9)

## 2020-05-30 LAB — MAGNESIUM: Magnesium: 2 mg/dL (ref 1.7–2.4)

## 2020-05-30 LAB — GLUCOSE, CAPILLARY
Glucose-Capillary: 82 mg/dL (ref 70–99)
Glucose-Capillary: 83 mg/dL (ref 70–99)
Glucose-Capillary: 95 mg/dL (ref 70–99)

## 2020-05-30 LAB — ETHANOL: Alcohol, Ethyl (B): 80 mg/dL — ABNORMAL HIGH (ref <10)

## 2020-05-30 LAB — MRSA PCR SCREENING: MRSA by PCR: NEGATIVE

## 2020-05-30 LAB — RESP PANEL BY RT-PCR (FLU A&B, COVID) ARPGX2
Influenza A by PCR: NEGATIVE
Influenza B by PCR: NEGATIVE
SARS Coronavirus 2 by RT PCR: NEGATIVE

## 2020-05-30 MED ORDER — ONDANSETRON HCL 4 MG/2ML IJ SOLN: 4.0000 mg | Freq: Four times a day (QID) | INTRAMUSCULAR | Status: DC | PRN

## 2020-05-30 MED ORDER — ALBUTEROL SULFATE (2.5 MG/3ML) 0.083% IN NEBU: 2.5000 mg | INHALATION_SOLUTION | Freq: Four times a day (QID) | RESPIRATORY_TRACT | Status: DC | PRN

## 2020-05-30 MED ORDER — PANTOPRAZOLE SODIUM 40 MG IV SOLR: 40.0000 mg | Freq: Every day | INTRAVENOUS | Status: DC

## 2020-05-30 MED ORDER — MAGNESIUM SULFATE 2 GM/50ML IV SOLN
2.0000 g | Freq: Once | INTRAVENOUS | Status: AC
  Administered 2020-05-30: 2 g via INTRAVENOUS
  Filled 2020-05-30: qty 50

## 2020-05-30 MED ORDER — FENTANYL CITRATE (PF) 100 MCG/2ML IJ SOLN
INTRAMUSCULAR | Status: AC
  Filled 2020-05-30: qty 2

## 2020-05-30 MED ORDER — SODIUM CHLORIDE 0.9 % IV SOLN
3.0000 g | Freq: Four times a day (QID) | INTRAVENOUS | Status: DC
  Administered 2020-05-30 – 2020-05-31 (×3): 3 g via INTRAVENOUS
  Filled 2020-05-30 (×3): qty 8

## 2020-05-30 MED ORDER — FENTANYL CITRATE (PF) 100 MCG/2ML IJ SOLN: 50.0000 ug | INTRAMUSCULAR | Status: DC | PRN

## 2020-05-30 MED ORDER — CHLORHEXIDINE GLUCONATE CLOTH 2 % EX PADS
6.0000 | MEDICATED_PAD | Freq: Every day | CUTANEOUS | Status: DC
  Administered 2020-05-31 – 2020-06-02 (×2): 6 via TOPICAL

## 2020-05-30 MED ORDER — MIDAZOLAM HCL 2 MG/2ML IJ SOLN
INTRAMUSCULAR | Status: AC
  Filled 2020-05-30: qty 2

## 2020-05-30 MED ORDER — SUCCINYLCHOLINE CHLORIDE 200 MG/10ML IV SOSY
PREFILLED_SYRINGE | INTRAVENOUS | Status: AC
  Filled 2020-05-30: qty 10

## 2020-05-30 MED ORDER — ETOMIDATE 2 MG/ML IV SOLN
INTRAVENOUS | Status: AC
  Filled 2020-05-30: qty 20

## 2020-05-30 MED ORDER — ENOXAPARIN SODIUM 40 MG/0.4ML ~~LOC~~ SOLN
40.0000 mg | SUBCUTANEOUS | Status: DC
  Administered 2020-05-30: 40 mg via SUBCUTANEOUS
  Filled 2020-05-30: qty 0.4

## 2020-05-30 MED ORDER — ONDANSETRON HCL 4 MG PO TABS: 4.0000 mg | ORAL_TABLET | Freq: Four times a day (QID) | ORAL | Status: DC | PRN

## 2020-05-30 MED ORDER — CHLORHEXIDINE GLUCONATE 0.12% ORAL RINSE (MEDLINE KIT)
15.0000 mL | Freq: Two times a day (BID) | OROMUCOSAL | Status: DC
  Administered 2020-05-30 – 2020-05-31 (×2): 15 mL via OROMUCOSAL

## 2020-05-30 MED ORDER — DEXMEDETOMIDINE HCL IN NACL 400 MCG/100ML IV SOLN: 0.0000 ug/kg/h | INTRAVENOUS | Status: DC

## 2020-05-30 MED ORDER — SODIUM CHLORIDE 0.9 % IV BOLUS
1000.0000 mL | Freq: Once | INTRAVENOUS | Status: AC
  Administered 2020-05-30: 1000 mL via INTRAVENOUS

## 2020-05-30 MED ORDER — ACETAMINOPHEN 650 MG RE SUPP
650.0000 mg | Freq: Four times a day (QID) | RECTAL | Status: DC | PRN
  Administered 2020-05-30: 650 mg via RECTAL
  Filled 2020-05-30: qty 1

## 2020-05-30 MED ORDER — SODIUM CHLORIDE 0.9 % IV SOLN
3.0000 g | Freq: Four times a day (QID) | INTRAVENOUS | Status: DC
  Filled 2020-05-30 (×3): qty 8

## 2020-05-30 MED ORDER — SODIUM CHLORIDE 0.9 % IV BOLUS (SEPSIS)
1000.0000 mL | Freq: Once | INTRAVENOUS | Status: AC
  Administered 2020-05-30: 1000 mL via INTRAVENOUS

## 2020-05-30 MED ORDER — ROCURONIUM BROMIDE 10 MG/ML (PF) SYRINGE
PREFILLED_SYRINGE | INTRAVENOUS | Status: AC
  Filled 2020-05-30: qty 10

## 2020-05-30 MED ORDER — POLYETHYLENE GLYCOL 3350 17 G PO PACK: 17.0000 g | PACK | Freq: Every day | ORAL | Status: DC

## 2020-05-30 MED ORDER — ORAL CARE MOUTH RINSE
15.0000 mL | OROMUCOSAL | Status: DC
  Administered 2020-05-30 – 2020-05-31 (×9): 15 mL via OROMUCOSAL

## 2020-05-30 MED ORDER — SODIUM CHLORIDE 0.9 % IV SOLN
1000.0000 mL | INTRAVENOUS | Status: DC
  Administered 2020-05-30 – 2020-05-31 (×3): 1000 mL via INTRAVENOUS

## 2020-05-30 MED ORDER — SODIUM CHLORIDE 0.9% FLUSH
3.0000 mL | Freq: Two times a day (BID) | INTRAVENOUS | Status: DC
  Administered 2020-05-30 – 2020-06-03 (×9): 3 mL via INTRAVENOUS

## 2020-05-30 MED ORDER — THIAMINE HCL 100 MG/ML IJ SOLN
100.0000 mg | Freq: Every day | INTRAMUSCULAR | Status: DC
  Administered 2020-05-30 – 2020-05-31 (×2): 100 mg via INTRAVENOUS
  Filled 2020-05-30 (×2): qty 2

## 2020-05-30 MED ORDER — DOCUSATE SODIUM 50 MG/5ML PO LIQD
100.0000 mg | Freq: Two times a day (BID) | ORAL | Status: DC
  Filled 2020-05-30 (×2): qty 10

## 2020-05-30 NOTE — Progress Notes (Signed)
Ashley Valencia [id number] Admission Data: 05/30/2020 6:31 PM Attending Provider: Lynnell Catalan, MD  [id number], Resa Miner, FNP Consults/ Treatment Team:   [name] is a 30 y.o. female patient admitted from ED awake, alert  & orientated  X 3,  Full Code, VSS - Blood pressure (!) 164/94, pulse (!) 115, temperature (!) 101.7 F (38.7 C), temperature source Axillary, resp. rate (!) 37, height 6\' 1"  (1.854 m), weight 104.3 kg, last menstrual period 05/29/2020, SpO2 95 %., O2   5 L nasal cannular, Tele 2M11 placed and pt is currently running:sinus tachycardia.   IV site WDL:  antecubital right, condition patent and no redness and left, condition patent and no redness with a transparent dsg that's clean dry and intact.    Skin, clean-dry- intact without evidence  skin tears.   No evidence of skin break down noted on exam.   Will cont to monitor and assist as needed,   05/31/2020, RN 05/30/2020 6:31 PM

## 2020-05-30 NOTE — ED Provider Notes (Signed)
[facility] EMERGENCY DEPARTMENT Provider Note   CSN: [id number] Arrival date & time: 05/29/20  2236     History Chief Complaint  Patient presents with  . Suicide Attempt    LEVEL 5 CAVEAT 2/2 AMS  Ashley Valencia is a 30 y.o. female.  30 year old female with history of major depression, migraines presents to the emergency department from home after reported overdose.  Patient attempted suicide by overdose on pills tonight related to impending divorce.  Unclear number of pills consumed, but patient did have a 90-day supply refilled of her medications last week.  Is prescribed 0.5 mg Xanax, pantoprazole 40 mg, fluvoxamine 50 mg, solifenacin 10 mg, and Abilify 10 mg.  Unclear use of alcohol or illicit drugs.  The history is provided by the EMS personnel and medical records. No language interpreter was used.       Past Medical History:  Diagnosis Date  . History of bladder problems   . Migraines     Patient Active Problem List   Diagnosis Date Noted  . Severe recurrent major depression without psychotic features (HCC) 01/01/2020  . Lumbar facet arthropathy 07/25/2018    Past Surgical History:  Procedure Laterality Date  . UMBILICAL HERNIA REPAIR     age 39     OB History   No obstetric history on file.     Family History  Problem Relation Age of Onset  . Diabetes Mother   . Hypertension Mother   . Kidney failure Mother   . Diabetes Maternal Grandmother   . Hypertension Maternal Grandmother   . Kidney failure Maternal Grandmother   . Diabetes Maternal Grandfather   . Hypertension Maternal Grandfather     Social History   Tobacco Use  . Smoking status: Never Smoker  . Smokeless tobacco: Never Used  Substance Use Topics  . Alcohol use: Yes    Comment: rarely  . Drug use: No    Home Medications Prior to Admission medications   Medication Sig Start Date End Date Taking? Authorizing Provider  ARIPiprazole (ABILIFY) 10 MG tablet Take 1  tablet (10 mg total) by mouth daily. 01/05/20   Jesse Sans, MD  darifenacin (ENABLEX) 15 MG 24 hr tablet Take 1 tablet (15 mg total) by mouth daily. 01/06/20   Jesse Sans, MD  EMGALITY 120 MG/ML SOAJ Inject 1 mL into the skin every 30 (thirty) days. 12/15/19   [provider]  FLUoxetine (PROZAC) 20 MG capsule Take 1 capsule (20 mg total) by mouth daily. 01/06/20   Jesse Sans, MD  propranolol (INDERAL) 40 MG tablet Take 1 tablet (40 mg total) by mouth daily. 01/06/20   Jesse Sans, MD  traZODone (DESYREL) 100 MG tablet Take 1 tablet (100 mg total) by mouth at bedtime as needed for sleep. 01/05/20   Jesse Sans, MD    Allergies    Patient has no known allergies.  Review of Systems   Review of Systems  Unable to perform ROS: Mental status change     Physical Exam Updated Vital Signs BP (!) 149/85   Pulse 88   Temp 98.1 F (36.7 C)   Resp (!) 24   Ht 6\' 1"  (1.854 m)   Wt 104.3 kg   LMP 05/29/2020   SpO2 100%   BMI 30.34 kg/m   Physical Exam Vitals and nursing note reviewed.  Constitutional:      General: She is not in acute distress.    Appearance: She  is well-developed. She is not diaphoretic.     Comments: Patient somnolent; unable to be aroused to sternal rubbing, loud voice.  HENT:     Head: Normocephalic and atraumatic.  Eyes:     General: No scleral icterus.    Conjunctiva/sclera: Conjunctivae normal.  Cardiovascular:     Rate and Rhythm: Normal rate and regular rhythm.     Pulses: Normal pulses.  Pulmonary:     Effort: Pulmonary effort is normal. No respiratory distress.     Comments: Lungs CTAB. Respirations even and unlabored. Musculoskeletal:        General: Normal range of motion.     Cervical back: Normal range of motion.  Skin:    General: Skin is warm and dry.     Coloration: Skin is not pale.     Findings: No erythema or rash.     ED Results / Procedures / Treatments   Labs (all labs ordered are listed, but  only abnormal results are displayed) Labs Reviewed  COMPREHENSIVE METABOLIC PANEL - Abnormal; Notable for the following components:      Result Value   Potassium 3.4 (*)    CO2 19 (*)    Glucose, Bld 127 (*)    All other components within normal limits  ETHANOL - Abnormal; Notable for the following components:   Alcohol, Ethyl (B) 80 (*)    All other components within normal limits  SALICYLATE LEVEL - Abnormal; Notable for the following components:   Salicylate Lvl <7.0 (*)    All other components within normal limits  ACETAMINOPHEN LEVEL - Abnormal; Notable for the following components:   Acetaminophen (Tylenol), Serum <10 (*)    All other components within normal limits  CBC - Abnormal; Notable for the following components:   RBC 5.21 (*)    All other components within normal limits  RAPID URINE DRUG SCREEN, HOSP PERFORMED - Abnormal; Notable for the following components:   Benzodiazepines POSITIVE (*)    Tetrahydrocannabinol POSITIVE (*)    All other components within normal limits  RESP PANEL BY RT-PCR (FLU A&B, COVID) ARPGX2  MAGNESIUM  I-STAT BETA HCG BLOOD, ED ([facility], [facility], [facility] ONLY)    EKG EKG Interpretation  Date/Time:  Sunday May 30 2020 03:50:29 EDT Ventricular Rate:  68 PR Interval:    QRS Duration: 106 QT Interval:  444 QTC Calculation: 473 R Axis:   68 Text Interpretation: Sinus rhythm No significant change was found Confirmed by Glynn Octave [phone]) on 05/30/2020 3:56:36 AM   Radiology No results found.  Procedures Procedures   Medications Ordered in ED Medications  sodium chloride 0.9 % bolus 1,000 mL (0 mLs Intravenous Stopped 05/30/20 0148)    ED Course  I have reviewed the triage vital signs and the nursing notes.  Pertinent labs & imaging results that were available during my care of the patient were reviewed by me and considered in my medical decision making (see chart for details).  Clinical Course as of 05/30/20 4128  Sun May 30, 2020   0140 Spoke with Patty at poison control; Xanax and Abilify can cause CNS depression, tachycardia, hypotension. Fluvoxamine may cause bradycardia, GI symptoms. Large overdose with risk of seizures. Solifenacin may cause symptoms similar to Benadryl overdose - benzo's as needed for management. Observation at least 8 hours. Repeat EKG 4 hours after first; only correct QTc >500 by maximizing potassium, Mg levels. Supportive care until back to baseline. [KH]  [phone] Patient continues to sleep.  Remains quite somnolent, unable  to be aroused with loud voice or sternal rubbing.  Vitals remained stable.  Normal oxygen saturations. [KH]  0352 QTc improved to 473 [KH]  0538 Patient continues to sleep. VSS. [KH]    Clinical Course User Index [KH] Darylene Price   MDM Rules/Calculators/A&P                          30 year old female presents to the emergency department following intentional overdose with suicidal intent.  She has been sleeping since arrival, difficult to be aroused.  Laboratory evaluation has been reassuring.  Vitals remained stable.  We will continue to observe until patient is more awake and alert.  Will require TTS consultation once medically cleared.  Care signed out to [place], [place] at shift change.   Final Clinical Impression(s) / ED Diagnoses Final diagnoses:  Intentional drug overdose, initial encounter [facility])  Depression, unspecified depression type    Rx / DC Orders ED Discharge Orders    None       Antony Madura, PA-C 05/30/20 0650    Glynn Octave, MD 05/30/20 [phone]

## 2020-05-30 NOTE — ED Notes (Signed)
Requested room with Charge RN [place]. Pt moved to Room 15. Informed PA Tresa Endo. EKG obtained. Awaiting new orders

## 2020-05-30 NOTE — ED Provider Notes (Addendum)
Pt's care turned over to me at 7am.  Pt reported to have overdosed on multiple medications.  Pt reexamined multiple times.  Pt remains very sleepy.  !2:00 noon, Pt will open eyes with sternal rub,  Pt will mumble when shaken.  Poison control had advised repeat bmet.  Repeat bmet normal   Pt observed for 13.5 hours.   Consult for admission for further observation,   Dr. Katrinka Blazing consulted for admission.  Rn reported pt had increased resp rate, decreased 02 sat and increased heart rate.   Pt reevaluated,  O2 sat 88, Pt less responsive,  Gurgling,  Rn suctioned pt's mouth,  I placed a 6.0 nasal trumpet.   Dr. Charm Barges in to see pt,  Pt currently maintaining her airway.  She will wake up to sternal rub.  I suspect pt aspirated.   Critical care consulted.  They will admit to ICU for further evaluation and management  IVC papers sent to [facility]     CRITICAL CARE Performed by: Langston Masker Total critical care time: 45 minutes Critical care time was exclusive of separately billable procedures and treating other patients. Critical care was necessary to treat or prevent imminent or life-threatening deterioration. Critical care was time spent personally by me on the following activities: development of treatment plan with patient and/or surrogate as well as nursing, discussions with consultants, evaluation of patient's response to treatment, examination of patient, obtaining history from patient or surrogate, ordering and performing treatments and interventions, ordering and review of laboratory studies, ordering and review of radiographic studies, pulse oximetry and re-evaluation of patient's condition. Elson Areas, PA-C 05/30/20 1230    [address] 05/30/20 1508    Terrilee Files, MD 05/30/20 [phone]

## 2020-05-30 NOTE — Consult Note (Signed)
NAME:  Ashley Valencia, MRN:  [id number], DOB:  Apr 16, 1990, LOS: 0 ADMISSION DATE:  05/29/2020, CONSULTATION DATE:  05/30/2020 REFERRING MD:  Jeanette Caprice, PA, CHIEF COMPLAINT:  Overdose/ SI   History of Present Illness:  HPI obtained from medical chart and EMS chart review and ED staff as patient remains encephalopathic and no family at bedside.    30 year old female with reported prior history of depression, migraines, and bladder problems presenting from home on 3/19 evening by EMS for intentional overdose by pills and ETOH in an attempt to kill herself.   Per EMS run sheet, she was alert and oriented, vital signs stable.  Reported that she took the following pills around 2130 but unclear amount of pills taken:  Xanax- 12 (0.5mg )- (verified with pharmacy she did fill 90 day supply 3/12) Pantoprazole 40mg - unknown amount taken Fluvoxamine 50mg - unknown amount taken solifenacin 10mg - unknown amount taken ariprezole 10mg - unknown amount taken Beer- 2  EMS did note empty pill bottles that were filled one week ago, 90 day supplies.  She reportedly stated she was trying to kill herself due to impending divorce but voluntary came to ER.  On arrival to ER, she was afebrile and hemodynamically stable but progressively lethargic but able to protect her airway.  Labs significant for ETOH 80, UDS positive benzo's and THC, neg tylenol/ salicylate, glucose 127, neg hCG quant, K 3.4, bicarb 19.  SARS2/ flu neg.  Treated with 2L NS in ER.  Was to be admitted to [facility], noted to have minimal improvement in mental status in ER.  Serial EKGs showed normal QTc, f/u tylenol level normal and BMET unchanged.  However this afternoon noted to be gurgling, now tachycardic, tachypneic, and desaturations into the 70-80's. Patient suctioned, placed on [place], and nasal trumpet placed.  Suspected aspiration.  PCCM consulted for ICU admit and assessment for intubation.   Addendum: spoke with patient's legal wife on the phone,  [phone], who states she was with her last night during events.  Reports since patient's mother passed in 2019, she has had depression issues and struggled.  Also acknowledged they are having current marital issues. States she had her involuntary committed in 12/2019 for depression and suicidal ideations.  She reports she has been drinking more heavily lately, smokes occasionally, and unknown for illicit drug abuse. Reports she was with patient last night and she was the one to call 911.  She was unable to stop her from downing all her pills.  She does not know what pills they were.   Pertinent  Medical History  Depression, migraines, bladder problems  Significant Hospital Events: Including procedures, antibiotic start and stop dates in addition to other pertinent events   . 3/19 presented with intentional SI/ OD by pills/ ETOH . 3/20 TRH to admit but patient developed respiratory distress in ER, PCCM to evaluate for intubation/ ICU  Interim History / Subjective:   Objective   Blood pressure (!) 143/81, pulse (!) 106, temperature 98.1 F (36.7 C), resp. rate (!) 41, height 6\' 1"  (1.854 m), weight 104.3 kg, last menstrual period 05/29/2020, SpO2 93 %.       No intake or output data in the 24 hours ending 05/30/20 1436 Filed Weights   05/29/20 2246  Weight: 104.3 kg   Examination: General:  Ill appearing young adult female in resp distress HEENT: MM pink/moist, pupils 5/reactive, nasal trumpet inplace Neuro: responds only to deep noxious sternal rub/ stimuli-will grimace and moan on my exam, does  not withdrawal or localize to extremity noxious stimuli CV: rr, no murmur PULM:  Tachypneic, shallow, rhonchi on right, clear left upper, diminished in bases with rhonchi/ rales GI: obese, soft, bs active  Extremities: warm/dry, delayed cap refill distally, no LE edema Skin: superficial cuts to left forearm  Labs/imaging personally reviewed   3/19 EKG, 3/20 EKG  Resolved Hospital Problem  list    Assessment & Plan:   Intentional SI by pills (mutiple and unknown amounts) and ETOH Acute toxic/ metabolic encephalopathy  Hx Depression Per ED documentation of poison control recommendations:  " Xanax and Abilify can cause CNS depression, tachycardia, hypotension. Fluvoxamine may cause bradycardia, GI symptoms. Large overdose with risk of seizures. Solifenacin may cause symptoms similar to Benadryl overdose - benzo's as needed for management. Observation at least 8 hours. Repeat EKG 4 hours after first; only correct QTc >500 by maximizing potassium, Mg levels. Supportive care until back to baseline" - repeat EKG now to monitor QTc - serial neuro exams and close monitoring of airway - repeat tylenol level neg - check EEG- r/o subclinical SE - avoid benzo reversal with flumazenil given chronic benzo use; increased risk of seizures - supportive care for now with close monitoring in ICU - remains normothermic and normo to slightly hypertensive, noted to be more tachycardic after aspiration episode in ER - empiric daily thiamine given ETOH use, monitor for withdrawals  - will need safety sitter and psych eval when encephalopathy resolved - bladder scan q 4 hr monitoring for retention with AMS  Acute hypoxic respiratory failure Aspiration pneumonia/ pneumonitis  - admit to ICU - high risk for intubation, nasal ETCO2 in the 28, concern for impending respiratory failure - baseline ABG now - continue supplemental O2 for sat goal > 94-99% - NPO - CXR with bilateral lower lung opacities c/w with aspiration - Aspiration precautions - start unasyn x 5 days, check PCT, if negative will d/c and monitor clinically    Best practice (evaluated daily)  Diet:  NPO Pain/Anxiety/Delirium protocol (if indicated): No VAP protocol (if indicated): Not indicated DVT prophylaxis: LMWH GI prophylaxis: N/A- unclear if OD on PPI at home Glucose control:  SSI No Central venous access:  N/A Arterial  line:  N/A Foley:  N/A Mobility:  bed rest  PT consulted: N/A Last date of multidisciplinary goals of care discussion [pending] Code Status:  full code Disposition: admit to ICU  Wife, Albin Felling [phone] updated by phone with plans to visit soon.   Labs   CBC: Recent Labs  Lab 05/29/20 2248  WBC 7.4  HGB 14.1  HCT 42.8  MCV 82.1  PLT 205    Basic Metabolic Panel: Recent Labs  Lab 05/29/20 0013 05/29/20 2248 05/30/20 0805  NA  --  137 140  K  --  3.4* 3.9  CL  --  108 108  CO2  --  19* 23  GLUCOSE  --  127* 130*  BUN  --  10 9  CREATININE  --  0.72 0.82  CALCIUM  --  9.2 8.8*  MG 2.0  --   --    GFR: Estimated Creatinine Clearance: 140.3 mL/min (by C-G formula based on SCr of 0.82 mg/dL). Recent Labs  Lab 05/29/20 2248  WBC 7.4    Liver Function Tests: Recent Labs  Lab 05/29/20 2248  AST 31  ALT 34  ALKPHOS 43  BILITOT 1.0  PROT 7.0  ALBUMIN 4.2   No results for input(s): LIPASE, AMYLASE in the last 168  hours. No results for input(s): AMMONIA in the last 168 hours.  ABG No results found for: PHART, PCO2ART, PO2ART, HCO3, TCO2, ACIDBASEDEF, O2SAT   Coagulation Profile: No results for input(s): INR, PROTIME in the last 168 hours.  Cardiac Enzymes: No results for input(s): CKTOTAL, CKMB, CKMBINDEX, TROPONINI in the last 168 hours.  HbA1C: Hgb A1c MFr Bld  Date/Time Value Ref Range Status  01/03/2020 08:26 AM 5.4 4.8 - 5.6 % Final    Comment:    (NOTE)         Prediabetes: 5.7 - 6.4         Diabetes: >6.4         Glycemic control for adults with diabetes: <7.0     CBG: Recent Labs  Lab 05/30/20 0811  GLUCAP 87    Review of Systems:   Unable given encephalopathy   Past Medical History:  She,  has a past medical history of History of bladder problems and Migraines.   Surgical History:   Past Surgical History:  Procedure Laterality Date  . UMBILICAL HERNIA REPAIR     age 51     Social History:  unable  Family History:   Unable   Allergies No Known Allergies   Home Medications - not able to verify at this time Prior to Admission medications   Medication Sig Start Date End Date Taking? Authorizing Provider  ARIPiprazole (ABILIFY) 10 MG tablet Take 1 tablet (10 mg total) by mouth daily. 01/05/20   Jesse Sans, MD  darifenacin (ENABLEX) 15 MG 24 hr tablet Take 1 tablet (15 mg total) by mouth daily. 01/06/20   Jesse Sans, MD  EMGALITY 120 MG/ML SOAJ Inject 1 mL into the skin every 30 (thirty) days. 12/15/19   [provider]  FLUoxetine (PROZAC) 20 MG capsule Take 1 capsule (20 mg total) by mouth daily. 01/06/20   Jesse Sans, MD  propranolol (INDERAL) 40 MG tablet Take 1 tablet (40 mg total) by mouth daily. 01/06/20   Jesse Sans, MD  traZODone (DESYREL) 100 MG tablet Take 1 tablet (100 mg total) by mouth at bedtime as needed for sleep. 01/05/20   Jesse Sans, MD     Critical care time: 55 mins     Posey Boyer, ACNP [facility] 05/30/2020, 3:52 PM  See Amion for pager If no response to pager, please call [phone] until 7pm After 7:00 pm call Elink  [phone]

## 2020-05-30 NOTE — ED Notes (Addendum)
Dr Charm Barges to pt room as respirations have not improved and aware of the concern that pt is having issues maintaining airway secretions

## 2020-05-30 NOTE — ED Notes (Signed)
Upon entering room noted pt to be tachypnea and gurgling. Airway suctioned and pt did bite down on tube. PA-C Sofia at bedside

## 2020-05-30 NOTE — ED Notes (Signed)
PA-C Sofia at bedside with nurse, nasal trumpet placed, pt did move hands towards her face.

## 2020-05-30 NOTE — ED Notes (Signed)
Provider at bedside to assess pt and aware of findings

## 2020-05-30 NOTE — Progress Notes (Signed)
eLink Physician-Brief Progress Note Patient Name: Ashley Valencia DOB: 01/16/1991 MRN: [id number]   Date of Service  05/30/2020  HPI/Events of Note  Fever to 102.0 F - Nursing request for Tylenol PR. AST and ALT both normal.   eICU Interventions  Plan: 1. Tylenol suppository 650 mg PR Q 6 hours PRN Temp > 101.0 F.     Intervention Category Major Interventions: Other:  Lenell Antu 05/30/2020, 8:38 PM

## 2020-05-30 NOTE — Progress Notes (Signed)
eLink Physician-Brief Progress Note Patient Name: Ashley Valencia DOB: 08-30-1990 MRN: [id number]   Date of Service  05/30/2020  HPI/Events of Note  Lactic Acid #1 = 5.1 - nBP = 163/84 with MAP = 104. Hgb = 14.3. Will continue to trend Lactic Acid.  eICU Interventions  Plan: 1. Repeat Lactic Acid at 9:30 PM.     Intervention Category Major Interventions: Acid-Base disturbance - evaluation and management  Lenell Antu 05/30/2020, 7:46 PM

## 2020-05-30 NOTE — ED Notes (Signed)
Pt continues to sleep. One on one sitter remains at bedside. V/S stable.

## 2020-05-30 NOTE — ED Notes (Signed)
Called PA [employer]. Informed pt moved to hallway bed 15. With decreased responsiveness when moving

## 2020-05-30 NOTE — ED Notes (Signed)
Dr Charm Barges at bedside, pt did respond slightly to his painful stimuli. Pt is tolerating nasal airway.

## 2020-05-30 NOTE — Progress Notes (Signed)
Pharmacy Antibiotic Note  SHEBA WHALING is a 30 y.o. female admitted on 05/29/2020 with aspiration pneumonia.  Pharmacy has been consulted for unasyn dosing.  WBC wnl, SCR wnl   Plan: -Start Unasyn 3 gm IV Q 6 hours -Monitor cultures, CBC and clinical progress  Height: 6\' 1"  (185.4 cm) Weight: 104.3 kg (230 lb) IBW/kg (Calculated) : 75.4  Temp (24hrs), Avg:98.1 F (36.7 C), Min:98.1 F (36.7 C), Max:98.1 F (36.7 C)  Recent Labs  Lab 05/29/20 2248 05/30/20 0805  WBC 7.4  --   CREATININE 0.72 0.82    Estimated Creatinine Clearance: 140.3 mL/min (by C-G formula based on SCr of 0.82 mg/dL).    No Known Allergies  Antimicrobials this admission: Unasyn 3/20 >>    Thank you for allowing pharmacy to be a part of this patient's care.  4/20, PharmD., BCPS, BCCCP Clinical Pharmacist Please refer to [facility] for unit-specific pharmacist

## 2020-05-31 ENCOUNTER — Inpatient Hospital Stay (HOSPITAL_COMMUNITY): Payer: BC Managed Care – PPO

## 2020-05-31 DIAGNOSIS — T50902A Poisoning by unspecified drugs, medicaments and biological substances, intentional self-harm, initial encounter: Secondary | ICD-10-CM | POA: Diagnosis not present

## 2020-05-31 DIAGNOSIS — J690 Pneumonitis due to inhalation of food and vomit: Secondary | ICD-10-CM | POA: Diagnosis not present

## 2020-05-31 DIAGNOSIS — F32A Depression, unspecified: Secondary | ICD-10-CM | POA: Diagnosis not present

## 2020-05-31 DIAGNOSIS — J9601 Acute respiratory failure with hypoxia: Secondary | ICD-10-CM

## 2020-05-31 LAB — CBC
HCT: 39.6 % (ref 36.0–46.0)
Hemoglobin: 12.5 g/dL (ref 12.0–15.0)
MCH: 26.7 pg (ref 26.0–34.0)
MCHC: 31.6 g/dL (ref 30.0–36.0)
MCV: 84.6 fL (ref 80.0–100.0)
Platelets: 139 10*3/uL — ABNORMAL LOW (ref 150–400)
RBC: 4.68 MIL/uL (ref 3.87–5.11)
RDW: 13.7 % (ref 11.5–15.5)
WBC: 13.2 10*3/uL — ABNORMAL HIGH (ref 4.0–10.5)
nRBC: 0 % (ref 0.0–0.2)

## 2020-05-31 LAB — COMPREHENSIVE METABOLIC PANEL
ALT: 22 U/L (ref 0–44)
AST: 19 U/L (ref 15–41)
Albumin: 3 g/dL — ABNORMAL LOW (ref 3.5–5.0)
Alkaline Phosphatase: 34 U/L — ABNORMAL LOW (ref 38–126)
Anion gap: 5 (ref 5–15)
BUN: 7 mg/dL (ref 6–20)
CO2: 22 mmol/L (ref 22–32)
Calcium: 8.3 mg/dL — ABNORMAL LOW (ref 8.9–10.3)
Chloride: 114 mmol/L — ABNORMAL HIGH (ref 98–111)
Creatinine, Ser: 0.9 mg/dL (ref 0.44–1.00)
GFR, Estimated: >60 mL/min (ref >60)
Glucose, Bld: 117 mg/dL — ABNORMAL HIGH (ref 70–99)
Potassium: 3.9 mmol/L (ref 3.5–5.1)
Sodium: 141 mmol/L (ref 135–145)
Total Bilirubin: 1.3 mg/dL — ABNORMAL HIGH (ref 0.3–1.2)
Total Protein: 5.4 g/dL — ABNORMAL LOW (ref 6.5–8.1)

## 2020-05-31 LAB — GLUCOSE, CAPILLARY
Glucose-Capillary: 112 mg/dL — ABNORMAL HIGH (ref 70–99)
Glucose-Capillary: 76 mg/dL (ref 70–99)
Glucose-Capillary: 109 mg/dL — ABNORMAL HIGH (ref 70–99)
Glucose-Capillary: 55 mg/dL — ABNORMAL LOW (ref 70–99)
Glucose-Capillary: 59 mg/dL — ABNORMAL LOW (ref 70–99)
Glucose-Capillary: 120 mg/dL — ABNORMAL HIGH (ref 70–99)
Glucose-Capillary: 54 mg/dL — ABNORMAL LOW (ref 70–99)
Glucose-Capillary: 66 mg/dL — ABNORMAL LOW (ref 70–99)
Glucose-Capillary: 96 mg/dL (ref 70–99)

## 2020-05-31 LAB — URINALYSIS, ROUTINE W REFLEX MICROSCOPIC
Bilirubin Urine: NEGATIVE
Glucose, UA: NEGATIVE mg/dL
Hgb urine dipstick: NEGATIVE
Ketones, ur: NEGATIVE mg/dL
Leukocytes,Ua: NEGATIVE
Nitrite: NEGATIVE
Protein, ur: NEGATIVE mg/dL
Specific Gravity, Urine: 1.021 (ref 1.005–1.030)
pH: 6 (ref 5.0–8.0)

## 2020-05-31 LAB — PROCALCITONIN: Procalcitonin: 0.2 ng/mL

## 2020-05-31 MED ORDER — DEXTROSE 50 % IV SOLN
INTRAVENOUS | Status: AC
  Filled 2020-05-31: qty 50

## 2020-05-31 MED ORDER — THIAMINE HCL 100 MG PO TABS: 100.0000 mg | ORAL_TABLET | Freq: Every day | ORAL | Status: DC

## 2020-05-31 MED ORDER — DEXTROSE IN LACTATED RINGERS 5 % IV SOLN: INTRAVENOUS | Status: DC

## 2020-05-31 MED ORDER — SODIUM CHLORIDE 0.9 % IV SOLN
3.0000 g | Freq: Four times a day (QID) | INTRAVENOUS | Status: DC
  Administered 2020-05-31 – 2020-06-02 (×8): 3 g via INTRAVENOUS
  Filled 2020-05-31 (×6): qty 8
  Filled 2020-05-31 (×2): qty 3
  Filled 2020-05-31: qty 8

## 2020-05-31 MED ORDER — DEXTROSE 50 % IV SOLN
1.0000 | Freq: Once | INTRAVENOUS | Status: AC
  Administered 2020-05-31: 50 mL via INTRAVENOUS

## 2020-05-31 MED ORDER — ENOXAPARIN SODIUM 60 MG/0.6ML ~~LOC~~ SOLN
55.0000 mg | SUBCUTANEOUS | Status: DC
  Administered 2020-05-31 – 2020-06-02 (×3): 55 mg via SUBCUTANEOUS
  Filled 2020-05-31 (×4): qty 0.55

## 2020-05-31 MED ORDER — TRAZODONE HCL 50 MG PO TABS
50.0000 mg | ORAL_TABLET | Freq: Every day | ORAL | Status: DC
  Administered 2020-05-31: 50 mg via ORAL
  Filled 2020-05-31: qty 1

## 2020-05-31 MED ORDER — CHLORHEXIDINE GLUCONATE 0.12 % MT SOLN
15.0000 mL | Freq: Two times a day (BID) | OROMUCOSAL | Status: DC
  Administered 2020-06-01 – 2020-06-02 (×4): 15 mL via OROMUCOSAL
  Filled 2020-05-31: qty 15

## 2020-05-31 MED ORDER — ORAL CARE MOUTH RINSE
15.0000 mL | Freq: Two times a day (BID) | OROMUCOSAL | Status: DC
  Administered 2020-05-31 – 2020-06-01 (×3): 15 mL via OROMUCOSAL

## 2020-05-31 MED ORDER — ADULT MULTIVITAMIN W/MINERALS CH
1.0000 | ORAL_TABLET | Freq: Every day | ORAL | Status: DC
  Administered 2020-05-31 – 2020-06-03 (×4): 1 via ORAL
  Filled 2020-05-31 (×4): qty 1

## 2020-05-31 MED ORDER — FOLIC ACID 1 MG PO TABS
1.0000 mg | ORAL_TABLET | Freq: Every day | ORAL | Status: DC
  Administered 2020-05-31 – 2020-06-03 (×4): 1 mg via ORAL
  Filled 2020-05-31 (×4): qty 1

## 2020-05-31 MED ORDER — DOCUSATE SODIUM 100 MG PO CAPS: 100.0000 mg | ORAL_CAPSULE | Freq: Two times a day (BID) | ORAL | Status: DC

## 2020-05-31 MED ORDER — POLYETHYLENE GLYCOL 3350 17 G PO PACK: 17.0000 g | PACK | Freq: Every day | ORAL | Status: DC

## 2020-05-31 MED ORDER — DEXTROSE 50 % IV SOLN: 12.5000 g | INTRAVENOUS | Status: AC

## 2020-05-31 MED ORDER — DEXTROSE 50 % IV SOLN
INTRAVENOUS | Status: AC
  Administered 2020-05-31: 12.5 g via INTRAVENOUS
  Filled 2020-05-31: qty 50

## 2020-05-31 NOTE — Progress Notes (Signed)
Hypoglycemic Event  CBG: 59  Treatment: 4 oz juice/soda  Symptoms: Hungry  Follow-up CBG: Time: 1615 CBG Result: 55  Possible Reasons for Event: Inadequate meal intake  Comments/MD notified: Order to give 1 amp of 50% dextrose and recheck CBG was 120    Ashley Valencia A

## 2020-05-31 NOTE — Progress Notes (Signed)
eLink Physician-Brief Progress Note Patient Name: Ashley Valencia DOB: 07-30-1990 MRN: [id number]   Date of Service  05/31/2020  HPI/Events of Note  Notified of episodes of agitation and hallucination. Directable but frequent. Patient asking for trazodone to help her sleep. Psych meds not yet resumed as per psych recommendation  eICU Interventions  Trazodone 50 mg ordered     Intervention Category Minor Interventions: Agitation / anxiety - evaluation and management  Darl Pikes 05/31/2020, 10:57 PM

## 2020-05-31 NOTE — Progress Notes (Signed)
NAME:  Ashley Valencia, MRN:  [id number], DOB:  01/17/1991, LOS: 1 ADMISSION DATE:  05/29/2020, CONSULTATION DATE:  05/30/2020 REFERRING MD:  Jeanette CapriceSophia, PA, CHIEF COMPLAINT:  Overdose/ SI   History of Present Illness:  HPI obtained from medical chart and EMS chart review and ED staff as patient remains encephalopathic and no family at bedside.    30 year old female with reported prior history of depression, migraines, and bladder problems presenting from home on 3/19 evening by EMS for intentional overdose by pills and ETOH in an attempt to kill herself.   Per EMS run sheet, she was alert and oriented, vital signs stable.  Reported that she took the following pills around 2130 but unclear amount of pills taken:  Xanax- 12 (0.5mg )- (verified with pharmacy she did fill 90 day supply 3/12) Pantoprazole 40mg - unknown amount taken Fluoxetine 50mg - unknown amount taken solifenacin 10mg - unknown amount taken ariprezole 10mg - unknown amount taken Beer- 2  EMS did note empty pill bottles that were filled one week ago, 90 day supplies.  She reportedly stated she was trying to kill herself due to impending divorce but voluntary came to ER.  On arrival to ER, she was afebrile and hemodynamically stable but progressively lethargic but able to protect her airway.  Labs significant for ETOH 80, UDS positive benzo's and THC, neg tylenol/ salicylate, glucose 127, neg hCG quant, K 3.4, bicarb 19.  SARS2/ flu neg.  Treated with 2L NS in ER.  [facility], noted to have minimal improvement in mental status in ER.  Serial EKGs showed normal QTc, f/u tylenol level normal and BMET unchanged.  However this afternoon noted to be gurgling, now tachycardic, tachypneic, and desaturations into the 70-80's. Patient suctioned, placed on [place], and nasal trumpet placed.  Suspected aspiration.  PCCM consulted for ICU admit and assessment for intubation.   Addendum: spoke with patient's legal wife on the phone, Albin FellingCarla  [phone], who states she was with her last night during events.  Reports since patient's mother passed in 2019, she has had depression issues and struggled.  Also acknowledged they are having current marital issues. States she had her involuntary committed in 12/2019 for depression and suicidal ideations.  She reports she has been drinking more heavily lately, smokes occasionally, and unknown for illicit drug abuse. Reports she was with patient last night and she was the one to call 911.  She was unable to stop her from downing all her pills.  She does not know what pills they were.   Pertinent  Medical History  Depression, migraines, bladder problems  Significant Hospital Events: Including procedures, antibiotic start and stop dates in addition to other pertinent events   . 3/19 presented with intentional SI/ OD by pills/ ETOH . 3/20 TRH to admit but patient developed respiratory distress in ER, PCCM to evaluate for intubation/ ICU  Interim History / Subjective:  This morning, [name] states that she is doing well and on further questioning she states that she feels stupid for trying to commit suicide.  She states that she took Xanax all her pills that she could find.  She did request to go home however I told her this was not an option presently.  She does not report of any prior history of bipolar or schizophrenia and states she does not follow-up with psychiatry  Objective   Blood pressure 136/82, pulse 94, temperature 98.4 F (36.9 C), temperature source Oral, resp. rate 19, height 6\' 1"  (1.854  m), weight 109.4 kg, last menstrual period 05/29/2020, SpO2 98 %.        Intake/Output Summary (Last 24 hours) at 05/31/2020 0723 Last data filed at 05/31/2020 0700 Gross per 24 hour  Intake 1937.45 ml  Output 1595 ml  Net 342.45 ml   Filed Weights   05/29/20 2246 05/31/20 0430  Weight: 104.3 kg 109.4 kg   Examination: Const: Lethargic younger appearing woman, does not appear to be in any  distress HEENT: Atraumatic, normocephalic, nasal cannula in place Resp: CTA BL, no wheezes, crackles, rhonchi CV: RRR, no murmurs, gallop, rub Abd: Bowel sounds present, nondistended, nontender to palpation Ext: No lower extremity edema, skin is warm to touch Neuro: Neurologically, she is alert and oriented to situation, treatment course/management, name, place, person.  Pupils are round, equal bilaterally and reactive to light.  Bulk motor strength intact. Patella reflexes 2+, no clonus  Cranial nerves II to XII grossly intact without focal neurological deficits Skin: Warm, no rashes  Labs/imaging personally reviewed   3/19 EKG, 3/20 EKG  Resolved Hospital Problem list     Assessment & Plan:   Intentional suicidal attempts by pills (mutiple and unknown amounts) and ETOH Acute toxic/ metabolic encephalopathy  History of severe recurrent major depression Presently, she remains lethargic however conversational.  She is alert and oriented to situation, treatment management.  She did express remorse for embarking on trying to take her life.  She also requested to be discharged   Plan: -Continue supportive care -Closely monitor vital signs -Bladder scan every 4 hours -Continue thiamine, folic acid, multivitamin  -Closely monitor for withdrawal symptoms from either alcohol or benzos -Consult psychiatry to assist with management -IVC patient  -Patient stable to transfer out of the ICU   Acute hypoxic respiratory failure Aspiration pneumonia/ pneumonitis  Currently satting well on 2 L nasal cannula.  Procalcitonin was unremarkable at 0.2.  Repeat chest x-ray shows persistent opacities  Plan: -Continue supplemental oxygen to maintain SPO2 greater than 94% -Continue aspiration precautions -Continue Unasyn  -Bedside swallow evaluation  -Continue monitoring respiratory status   Fevers She had several episodes of fever with T-max of 102 F for which she received Tylenol.   Given  that she is on an SSRI and an antipsychotic, she could probably had developed either an neuroleptic malignant syndrome or serotonin syndrome. Currently, she remains afebrile with no sign of clonus, AMS or hyper-reflexia  Plan: -If fevers, altered mental status, rigidity or dysautonomia occurs, will treat for NMS with either benzodiazepine/dantrolene -Continue to hold antipsychotics and antidepressants -No indication for cyproheptadine presently. -Appreciate psychiatry recommendations    Best practice (evaluated daily)  Diet:  NPO Pain/Anxiety/Delirium protocol (if indicated): No VAP protocol (if indicated): Not indicated DVT prophylaxis: LMWH GI prophylaxis: N/A- unclear if OD on PPI at home Glucose control:  SSI No Central venous access:  N/A Arterial line:  N/A Foley:  N/A Mobility:  bed rest  PT consulted: N/A Last date of multidisciplinary goals of care discussion [pending] Code Status:  full code Disposition: admit to ICU  Talk to wife Albin Felling [phone] at bedside today  Labs   CBC: Recent Labs  Lab 05/29/20 2248 05/30/20 1556 05/31/20 0359  WBC 7.4  --  13.2*  HGB 14.1 14.3 12.5  HCT 42.8 42.0 39.6  MCV 82.1  --  84.6  PLT 205  --  139*    Basic Metabolic Panel: Recent Labs  Lab 05/29/20 0013 05/29/20 2248 05/30/20 0805 05/30/20 1556 05/31/20 0308  NA  --  137 140 143 141  K  --  3.4* 3.9 3.8 3.9  CL  --  108 108  --  114*  CO2  --  19* 23  --  22  GLUCOSE  --  127* 130*  --  117*  BUN  --  10 9  --  7  CREATININE  --  0.72 0.82  --  0.90  CALCIUM  --  9.2 8.8*  --  8.3*  MG 2.0  --   --   --   --    GFR: Estimated Creatinine Clearance: 130.8 mL/min (by C-G formula based on SCr of 0.9 mg/dL). Recent Labs  Lab 05/29/20 2248 05/30/20 1824 05/30/20 2130 05/31/20 0308 05/31/20 0359  PROCALCITON  --  <0.10  --  0.20  --   WBC 7.4  --   --   --  13.2*  LATICACIDVEN  --  5.1* 2.8*  --   --     Liver Function Tests: Recent Labs  Lab  05/29/20 2248 05/31/20 0308  AST 31 19  ALT 34 22  ALKPHOS 43 34*  BILITOT 1.0 1.3*  PROT 7.0 5.4*  ALBUMIN 4.2 3.0*   No results for input(s): LIPASE, AMYLASE in the last 168 hours. No results for input(s): AMMONIA in the last 168 hours.  ABG    Component Value Date/Time   PHART 7.354 05/30/2020 1556   PCO2ART 32.1 05/30/2020 1556   PO2ART 75 (L) 05/30/2020 1556   HCO3 17.9 (L) 05/30/2020 1556   TCO2 19 (L) 05/30/2020 1556   ACIDBASEDEF 7.0 (H) 05/30/2020 1556   O2SAT 94.0 05/30/2020 1556     Coagulation Profile: No results for input(s): INR, PROTIME in the last 168 hours.  Cardiac Enzymes: No results for input(s): CKTOTAL, CKMB, CKMBINDEX, TROPONINI in the last 168 hours.  HbA1C: Hgb A1c MFr Bld  Date/Time Value Ref Range Status  01/03/2020 08:26 AM 5.4 4.8 - 5.6 % Final    Comment:    (NOTE)         Prediabetes: 5.7 - 6.4         Diabetes: >6.4         Glycemic control for adults with diabetes: <7.0     CBG: Recent Labs  Lab 05/30/20 0811 05/30/20 1842 05/30/20 1955 05/30/20 2312 05/31/20 0257  GLUCAP 87 82 83 95 112*    Review of Systems:   Unable given encephalopathy   Past Medical History:  She,  has a past medical history of History of bladder problems and Migraines.   Surgical History:   Past Surgical History:  Procedure Laterality Date  . UMBILICAL HERNIA REPAIR     age 79     Social History:  unable  Family History:  Unable   Allergies No Known Allergies   Home Medications - not able to verify at this time Prior to Admission medications   Medication Sig Start Date End Date Taking? Authorizing Provider  ARIPiprazole (ABILIFY) 10 MG tablet Take 1 tablet (10 mg total) by mouth daily. 01/05/20   Jesse Sans, MD  darifenacin (ENABLEX) 15 MG 24 hr tablet Take 1 tablet (15 mg total) by mouth daily. 01/06/20   Jesse Sans, MD  EMGALITY 120 MG/ML SOAJ Inject 1 mL into the skin every 30 (thirty) days. 12/15/19   [provider]  FLUoxetine (PROZAC) 20 MG capsule Take 1 capsule (20 mg total) by mouth daily. 01/06/20   Jesse Sans, MD  propranolol Bevely Palmer)  40 MG tablet Take 1 tablet (40 mg total) by mouth daily. 01/06/20   Jesse Sans, MD  traZODone (DESYREL) 100 MG tablet Take 1 tablet (100 mg total) by mouth at bedtime as needed for sleep. 01/05/20   Jesse Sans, MD     Critical care time: 55 mins     Posey Boyer, ACNP [facility] 05/31/2020, 7:23 AM  See Amion for pager If no response to pager, please call [phone] until 7pm After 7:00 pm call Elink  [phone]

## 2020-05-31 NOTE — Evaluation (Signed)
Clinical/Bedside Swallow Evaluation Patient Details  Name: Ashley Valencia MRN: [id number] Date of Birth: 09/30/1990  Today's Date: 05/31/2020 Time: SLP Start Time (ACUTE ONLY): 0955 SLP Stop Time (ACUTE ONLY): 1010 SLP Time Calculation (min) (ACUTE ONLY): 15 min  Past Medical History:  Past Medical History:  Diagnosis Date  . History of bladder problems   . Migraines    Past Surgical History:  Past Surgical History:  Procedure Laterality Date  . UMBILICAL HERNIA REPAIR     age 84   HPI:  30 year old female with reported prior history of depression, migraines, and bladder problems presented from home on 3/19 evening by EMS for intentional overdose by pills and ETOH. 3/20 developed respiratory distress in ED, transferred to ICU- now with asp pna.   Assessment / Plan / Recommendation Clinical Impression  Pt quite groggy, required prompts to sustain alertness during eval.  Sister arrived and MS improved with clearer speech and more intentional communication. Pt demonstrated adequate attention to applesauce and ice chips, sips of thin liquid with no overt s/s of aspiration.  Appears to protect airway.  Recommend starting a full liquid diet for today - SLP will follow briefly for diet advancement/safety and progress diet as MS improves. D/W pt, sister, and Charity fundraiser. SLP Visit Diagnosis: Dysphagia, unspecified (R13.10)    Aspiration Risk  Mild aspiration risk    Diet Recommendation   full liquid diet  Medication Administration: Whole meds with liquid    Other  Recommendations Oral Care Recommendations: Oral care BID   Follow up Recommendations None      Frequency and Duration min 2x/week  1 week       Prognosis Prognosis for Safe Diet Advancement: Good      Swallow Study   General Date of Onset: 05/30/20 HPI: 30 year old female with reported prior history of depression, migraines, and bladder problems presented from home on 3/19 evening by EMS for intentional overdose by pills  and ETOH. 3/20 developed respiratory distress in ED, transferred to ICU- now with asp pna. Type of Study: Bedside Swallow Evaluation Previous Swallow Assessment: no Diet Prior to this Study: NPO Temperature Spikes Noted: No Respiratory Status: Nasal cannula History of Recent Intubation: No Behavior/Cognition: Lethargic/Drowsy;Confused Oral Cavity Assessment: Within Functional Limits Oral Care Completed by SLP: No Oral Cavity - Dentition: Adequate natural dentition Self-Feeding Abilities: Needs assist Patient Positioning: Upright in bed Baseline Vocal Quality: Normal Volitional Cough: Weak Volitional Swallow: Able to elicit    Oral/Motor/Sensory Function Overall Oral Motor/Sensory Function: Within functional limits   Ice Chips Ice chips: Within functional limits   Thin Liquid Thin Liquid: Within functional limits    Nectar Thick Nectar Thick Liquid: Not tested   Honey Thick Honey Thick Liquid: Not tested   Puree Puree: Within functional limits   Solid     Solid: Not tested      Ashley Valencia Ashley Valencia 05/31/2020,10:18 AM  Ashley Valencia L. Ashley Valencia, Ashley Valencia [facility] Office number [phone] Pager [phone]

## 2020-05-31 NOTE — TOC Initial Note (Signed)
Transition of Care [facility]) - Initial/Assessment Note    Patient Details  Name: Ashley Valencia MRN: [id number] Date of Birth: 1991-02-14  [facility] CM/SW Contact:    Carley Hammed, LCSWA Phone Number: 05/31/2020, 4:43 PM  Clinical Narrative:                 CSW was consulted to complete an IVC as pt has recommendations for inpatient psych and has stated that she wants to leave AMA. IVC completed and [employer] called to serve. SW will follow for psych bed placement when appropriate.  Expected Discharge Plan: Psychiatric Hospital Barriers to Discharge: Insurance Authorization,Continued Medical Work up,Psych Bed not available   Patient Goals and CMS Choice        Expected Discharge Plan and Services Expected Discharge Plan: Psychiatric Hospital       Living arrangements for the past 2 months: Single Family Home                                      Prior Living Arrangements/Services Living arrangements for the past 2 months: Single Family Home Lives with:: Spouse Patient language and need for interpreter reviewed:: Yes        Need for Family Participation in Patient Care: No (Comment) Care giver support system in place?: No (comment)   Criminal Activity/Legal Involvement Pertinent to Current Situation/Hospitalization: No - Comment as needed  Activities of Daily Living      Permission Sought/Granted Permission sought to share information with : Family Supports                Emotional Assessment Appearance:: Appears stated age Attitude/Demeanor/Rapport: Unable to Assess Affect (typically observed): Unable to Assess   Alcohol / Substance Use: Not Applicable Psych Involvement: No (comment)  Admission diagnosis:  Overdose [T50.901A] Intentional drug overdose, initial encounter (HCC) [T50.902A] Depression, unspecified depression type [F32.A] Patient Active Problem List   Diagnosis Date Noted  . Intentional drug overdose, initial encounter  (HCC) 05/30/2020  . Overdose 05/30/2020  . Severe recurrent major depression without psychotic features (HCC) 01/01/2020  . Lumbar facet arthropathy 07/25/2018   PCP:  Soundra Pilon, FNP Pharmacy:   [facility] [address] [place], [place] - [address] [facility] [place]. [place] [address] Phone: [phone] Fax: [phone]     Social Determinants of Health (SDOH) Interventions    Readmission Risk Interventions No flowsheet data found.

## 2020-05-31 NOTE — Progress Notes (Signed)
eLink Physician-Brief Progress Note Patient Name: Ashley Valencia DOB: Feb 26, 1991 MRN: [id number]   Date of Service  05/31/2020  HPI/Events of Note  Notified of hypoglycemic episode despite taking sandwiches, apple sauce and jelo throughout the day. CBGS 50s. Patient seen conversant with family/friends at bedside.  On thiamine, folic acid and MVI for ETOH abuse  eICU Interventions  Will start D5 LR at 50 cc/hr     Intervention Category Intermediate Interventions: Other:  Darl Pikes 05/31/2020, 8:12 PM

## 2020-05-31 NOTE — Consult Note (Signed)
[facility] Face-to-Face Psychiatry Consult   Reason for Consult:  Suicide attempt by overdose Referring Physician:  Dr. Tacy Learn Patient Identification: Ashley Valencia MRN:  [id number] Principal Diagnosis: Severe recurrent major depression without psychotic features ([place]) Diagnosis:  Principal Problem:   Severe recurrent major depression without psychotic features ([place]) Active Problems:   Intentional drug overdose, initial encounter ([place])   Overdose   Total Time spent with patient: 45 minutes  Subjective:   Ashley Valencia is a 30 y.o. female patient admitted with suicide attempt by overdose on multiple medications.  Patient is seen and assessed, however not able to put forth much information for complete psychiatric evaluation.  Her sister Ashley Valencia is noted to be at the bedside, consent is obtained to speak with [facility] on her behalf.  Patient is observed to be very groggy and lethargic, she required multiple prompts to include name-calling and shaking in an attempt to arouse her to participate.  From what I am able to gather from the patient, she had an argument with her significant other and overdose.  "I just had an argument with my wife.  We got married in October 2021, but we were together for less than 1 year.  " She states previously she was receiving psychiatric services, however unable to afford her medication so she stopped going.  She is able to tell me she had 1 previous suicide attempt " like this", that resulted in her inpatient admission.  She also was able to verbalize that her father was diagnosed bipolar and schizophrenic.  Collateral obtained from sister at the bedside who corroborates her story to include patient attempted suicide by overdose on unknown amount of pills.  Her sister reports patient's wife did not come home, which triggered the argument as they have been discussing a divorce.  Sister reports an ongoing history of depression and anxiety, that has worsened with pending  separation from wife.  Her sister reports she was receiving outpatient therapy, and seeing a psychiatrist."  We recently had encouraged her to start those meetings back, as they were helping her."  Sister also reports recent suicide attempt in October 2021 by overdose."  Her last attempt was nothing like this, she was sent to the ED for clearance, and then went to the psychiatric hospital."  Sister is unable to contribute any additional family history.  Sister denies any legal charges, and or illicit substance abuse.  She states her sister is employed at the [employer] center in [place], and she has no children.  She states she is concerned about her sister's overall wellbeing, and would like for her to get some help.  HPI:  30 year old female with reported prior history of depression, migraines, and bladder problems presented from home on 3/19 evening by EMS for intentional overdose by pills and ETOH. 3/20 developed respiratory distress in ED, transferred to ICU- now with asp pna. Per EMS run sheet, she was alert and oriented, vital signs stable.  Reported that she took the following pills around 2130 but unclear amount of pills taken:  Xanax- 12 (0.81m)- (verified with pharmacy she did fill 90 day supply 3/12) Pantoprazole 478m unknown amount taken Fluvoxamine 5053munknown amount taken solifenacin 39m69mnknown amount taken ariprezole 39mg41mknown amount taken Beer- 2  EMS did note empty pill bottles that were filled one week ago, 90 day supplies.  She reportedly stated she was trying to kill herself due to impending divorce but voluntary came to ER.  Past Psychiatric History:  Depression, previously being treated with fluoxetine and Abilify.  1 documented suicide attempt in October 2021, that resulted in only hospitalization at [facility].  No other suicide attempts noted.  Prior to receiving psychiatric services and mood treatment center, patient was receiving treatment for  depression by primary care to include Abilify, Zoloft, and amitriptyline. Risk to Self:  Yes Risk to Others:  No Prior Inpatient Therapy:  [facility] in October 2021, for suicide attempt by overdose Prior Outpatient Therapy:  Was previously receiving services at mood treatment center.  Past Medical History:  Past Medical History:  Diagnosis Date  . History of bladder problems   . Migraines     Past Surgical History:  Procedure Laterality Date  . UMBILICAL HERNIA REPAIR     age 45   Family History:  Family History  Problem Relation Age of Onset  . Diabetes Mother   . Hypertension Mother   . Kidney failure Mother   . Diabetes Maternal Grandmother   . Hypertension Maternal Grandmother   . Kidney failure Maternal Grandmother   . Diabetes Maternal Grandfather   . Hypertension Maternal Grandfather    Family Psychiatric  History: As per patient father diagnosed with bipolar schizophrenia Social History:  Social History   Substance and Sexual Activity  Alcohol Use Yes   Comment: rarely     Social History   Substance and Sexual Activity  Drug Use No    Social History   Socioeconomic History  . Marital status: Married    Spouse name: Not on file  . Number of children: Not on file  . Years of education: Not on file  . Highest education level: Not on file  Occupational History  . Not on file  Tobacco Use  . Smoking status: Never Smoker  . Smokeless tobacco: Never Used  Substance and Sexual Activity  . Alcohol use: Yes    Comment: rarely  . Drug use: No  . Sexual activity: Not on file  Other Topics Concern  . Not on file  Social History Narrative  . Not on file   Social Determinants of Health   Financial Resource Strain: Not on file  Food Insecurity: Not on file  Transportation Needs: Not on file  Physical Activity: Not on file  Stress: Not on file  Social Connections: Not on file   Additional Social History:    Allergies:  No Known  Allergies  Labs:  Results for orders placed or performed during the hospital encounter of 05/29/20 (from the past 48 hour(s))  Comprehensive metabolic panel     Status: Abnormal   Collection Time: 05/29/20 10:48 PM  Result Value Ref Range   Sodium 137 135 - 145 mmol/L   Potassium 3.4 (L) 3.5 - 5.1 mmol/L   Chloride 108 98 - 111 mmol/L   CO2 19 (L) 22 - 32 mmol/L   Glucose, Bld 127 (H) 70 - 99 mg/dL    Comment: Glucose reference range applies only to samples taken after fasting for at least 8 hours.   BUN 10 6 - 20 mg/dL   Creatinine, Ser 0.72 0.44 - 1.00 mg/dL   Calcium 9.2 8.9 - 10.3 mg/dL   Total Protein 7.0 6.5 - 8.1 g/dL   Albumin 4.2 3.5 - 5.0 g/dL   AST 31 15 - 41 U/L   ALT 34 0 - 44 U/L   Alkaline Phosphatase 43 38 - 126 U/L   Total Bilirubin 1.0 0.3 - 1.2 mg/dL   GFR, Estimated >  60 >60 mL/min    Comment: (NOTE) Calculated using the CKD-EPI Creatinine Equation (2021)    Anion gap 10 5 - 15    Comment: Performed at [facility], [place] [address] [place], [place] [address]  cbc     Status: Abnormal   Collection Time: 05/29/20 10:48 PM  Result Value Ref Range   WBC 7.4 4.0 - 10.5 K/uL   RBC 5.21 (H) 3.87 - 5.11 MIL/uL   Hemoglobin 14.1 12.0 - 15.0 g/dL   HCT 42.8 36.0 - 46.0 %   MCV 82.1 80.0 - 100.0 fL   MCH 27.1 26.0 - 34.0 pg   MCHC 32.9 30.0 - 36.0 g/dL   RDW 13.2 11.5 - 15.5 %   Platelets 205 150 - 400 K/uL   nRBC 0.0 0.0 - 0.2 %    Comment: Performed at [facility], [place] [address], [place], [place] [address]  I-Stat beta hCG blood, ED     Status: None   Collection Time: 05/29/20 11:41 PM  Result Value Ref Range   I-stat hCG, quantitative <5.0 <5 mIU/mL   Comment 3            Comment:   GEST. AGE      CONC.  (mIU/mL)   <=1 WEEK        5 - 50     2 WEEKS       50 - 500     3 WEEKS       100 - 10,000     4 WEEKS     1,000 - 30,000        FEMALE AND NON-PREGNANT FEMALE:     LESS THAN 5 mIU/mL   Rapid urine drug screen (hospital performed)      Status: Abnormal   Collection Time: 05/29/20 11:58 PM  Result Value Ref Range   Opiates NONE DETECTED NONE DETECTED   Cocaine NONE DETECTED NONE DETECTED   Benzodiazepines POSITIVE (A) NONE DETECTED   Amphetamines NONE DETECTED NONE DETECTED   Tetrahydrocannabinol POSITIVE (A) NONE DETECTED   Barbiturates NONE DETECTED NONE DETECTED    Comment: (NOTE) DRUG SCREEN FOR MEDICAL PURPOSES ONLY.  IF CONFIRMATION IS NEEDED FOR ANY PURPOSE, NOTIFY LAB WITHIN 5 DAYS.  LOWEST DETECTABLE LIMITS FOR URINE DRUG SCREEN Drug Class                     Cutoff (ng/mL) Amphetamine and metabolites    1000 Barbiturate and metabolites    200 Benzodiazepine                 427 Tricyclics and metabolites     300 Opiates and metabolites        300 Cocaine and metabolites        300 THC                            50 Performed at [facility], [place] [address], [place], [place] [address]   Resp Panel by RT-PCR (Flu A&B, Covid) Nasopharyngeal Swab     Status: None   Collection Time: 05/30/20 12:54 AM   Specimen: Nasopharyngeal Swab; Nasopharyngeal(NP) swabs in vial transport medium  Result Value Ref Range   SARS Coronavirus 2 by RT PCR NEGATIVE NEGATIVE    Comment: (NOTE) SARS-CoV-2 target nucleic acids are NOT DETECTED.  The SARS-CoV-2 RNA is generally detectable in upper respiratory specimens during the acute  phase of infection. The lowest concentration of SARS-CoV-2 viral copies this assay can detect is 138 copies/mL. A negative result does not preclude SARS-Cov-2 infection and should not be used as the sole basis for treatment or other patient management decisions. A negative result may occur with  improper specimen collection/handling, submission of specimen other than nasopharyngeal swab, presence of viral mutation(s) within the areas targeted by this assay, and inadequate number of viral copies(<138 copies/mL). A negative result must be combined with clinical observations, patient  history, and epidemiological information. The expected result is Negative.  Fact Sheet for Patients:  EntrepreneurPulse.com.au  Fact Sheet for Healthcare Providers:  IncredibleEmployment.be  This test is no t yet approved or cleared by the [place] FDA and  has been authorized for detection and/or diagnosis of SARS-CoV-2 by FDA under an Emergency Use Authorization (EUA). This EUA will remain  in effect (meaning this test can be used) for the duration of the COVID-19 declaration under Section 564(b)(1) of the Act, 21 U.S.C.section 360bbb-3(b)(1), unless the authorization is terminated  or revoked sooner.       Influenza A by PCR NEGATIVE NEGATIVE   Influenza B by PCR NEGATIVE NEGATIVE    Comment: (NOTE) The Xpert Xpress SARS-CoV-2/FLU/RSV plus assay is intended as an aid in the diagnosis of influenza from Nasopharyngeal swab specimens and should not be used as a sole basis for treatment. Nasal washings and aspirates are unacceptable for Xpert Xpress SARS-CoV-2/FLU/RSV testing.  Fact Sheet for Patients: EntrepreneurPulse.com.au  Fact Sheet for Healthcare Providers: IncredibleEmployment.be  This test is not yet approved or cleared by the [place] FDA and has been authorized for detection and/or diagnosis of SARS-CoV-2 by FDA under an Emergency Use Authorization (EUA). This EUA will remain in effect (meaning this test can be used) for the duration of the COVID-19 declaration under Section 564(b)(1) of the Act, 21 U.S.C. section 360bbb-3(b)(1), unless the authorization is terminated or revoked.  Performed at [facility], [place] [address], [place], [place] [address]   Basic metabolic panel     Status: Abnormal   Collection Time: 05/30/20  8:05 AM  Result Value Ref Range   Sodium 140 135 - 145 mmol/L   Potassium 3.9 3.5 - 5.1 mmol/L   Chloride 108 98 - 111 mmol/L   CO2 23 22 - 32 mmol/L    Glucose, Bld 130 (H) 70 - 99 mg/dL    Comment: Glucose reference range applies only to samples taken after fasting for at least 8 hours.   BUN 9 6 - 20 mg/dL   Creatinine, Ser 0.82 0.44 - 1.00 mg/dL   Calcium 8.8 (L) 8.9 - 10.3 mg/dL   GFR, Estimated >60 >60 mL/min    Comment: (NOTE) Calculated using the CKD-EPI Creatinine Equation (2021)    Anion gap 9 5 - 15    Comment: Performed at [facility] [address], [place], [place] [address]  POC CBG, ED     Status: None   Collection Time: 05/30/20  8:11 AM  Result Value Ref Range   Glucose-Capillary 87 70 - 99 mg/dL    Comment: Glucose reference range applies only to samples taken after fasting for at least 8 hours.  Acetaminophen level     Status: Abnormal   Collection Time: 05/30/20 12:42 PM  Result Value Ref Range   Acetaminophen (Tylenol), Serum <10 (L) 10 - 30 ug/mL    Comment: (NOTE) Therapeutic concentrations vary significantly. A range of 10-30 ug/mL  may  be an effective concentration for many patients. However, some  are best treated at concentrations outside of this range. Acetaminophen concentrations >150 ug/mL at 4 hours after ingestion  and >50 ug/mL at 12 hours after ingestion are often associated with  toxic reactions.  Performed at [facility], [place] [address] [place], [place] [address]   MRSA PCR Screening     Status: None   Collection Time: 05/30/20  3:54 PM   Specimen: Nasopharyngeal  Result Value Ref Range   MRSA by PCR NEGATIVE NEGATIVE    Comment:        The GeneXpert MRSA Assay (FDA approved for NASAL specimens only), is one component of a comprehensive MRSA colonization surveillance program. It is not intended to diagnose MRSA infection nor to guide or monitor treatment for MRSA infections. Performed at [facility], [place] [address] [place], [place] [address]   I-STAT 7, (LYTES, BLD GAS, ICA, H+H)     Status: Abnormal   Collection Time: 05/30/20  3:56 PM  Result Value Ref  Range   pH, Arterial 7.354 7.350 - 7.450   pCO2 arterial 32.1 32.0 - 48.0 mmHg   pO2, Arterial 75 (L) 83.0 - 108.0 mmHg   Bicarbonate 17.9 (L) 20.0 - 28.0 mmol/L   TCO2 19 (L) 22 - 32 mmol/L   O2 Saturation 94.0 %   Acid-base deficit 7.0 (H) 0.0 - 2.0 mmol/L   Sodium 143 135 - 145 mmol/L   Potassium 3.8 3.5 - 5.1 mmol/L   Calcium, Ion 1.26 1.15 - 1.40 mmol/L   HCT 42.0 36.0 - 46.0 %   Hemoglobin 14.3 12.0 - 15.0 g/dL   Sample type ARTERIAL   HIV Antibody (routine testing w rflx)     Status: None   Collection Time: 05/30/20  6:24 PM  Result Value Ref Range   HIV Screen 4th Generation wRfx Non Reactive Non Reactive    Comment: Performed at [facility] [address] [place], [place] [address]  Salicylate level     Status: Abnormal   Collection Time: 05/30/20  6:24 PM  Result Value Ref Range   Salicylate Lvl <2.0 (L) 7.0 - 30.0 mg/dL    Comment: Performed at [facility] [address], [place], [place] [address]  Lactic acid, plasma     Status: Abnormal   Collection Time: 05/30/20  6:24 PM  Result Value Ref Range   Lactic Acid, Venous 5.1 (HH) 0.5 - 1.9 mmol/L    Comment: CRITICAL RESULT CALLED TO, READ BACK BY AND VERIFIED WITH: S.VAVERITO,RN 05/30/2020 1919 DAVISB Performed at [facility], [place] [address], [place], [place] [address]   Procalcitonin - Baseline     Status: None   Collection Time: 05/30/20  6:24 PM  Result Value Ref Range   Procalcitonin <0.10 ng/mL    Comment:        Interpretation: PCT (Procalcitonin) <= 0.5 ng/mL: Systemic infection (sepsis) is not likely. Local bacterial infection is possible. (NOTE)       Sepsis PCT Algorithm           Lower Respiratory Tract                                      Infection PCT Algorithm    ----------------------------     ----------------------------         PCT < 0.25 ng/mL  PCT < 0.10 ng/mL          Strongly encourage             Strongly discourage   discontinuation of antibiotics     initiation of antibiotics    ----------------------------     -----------------------------       PCT 0.25 - 0.50 ng/mL            PCT 0.10 - 0.25 ng/mL               OR       >80% decrease in PCT            Discourage initiation of                                            antibiotics      Encourage discontinuation           of antibiotics    ----------------------------     -----------------------------         PCT >= 0.50 ng/mL              PCT 0.26 - 0.50 ng/mL               AND        <80% decrease in PCT             Encourage initiation of                                             antibiotics       Encourage continuation           of antibiotics    ----------------------------     -----------------------------        PCT >= 0.50 ng/mL                  PCT > 0.50 ng/mL               AND         increase in PCT                  Strongly encourage                                      initiation of antibiotics    Strongly encourage escalation           of antibiotics                                     -----------------------------                                           PCT <= 0.25 ng/mL                                                 OR                                        >  80% decrease in PCT                                      Discontinue / Do not initiate                                             antibiotics  Performed at [facility], [place] [address] [place], [place] [address]   Glucose, capillary     Status: None   Collection Time: 05/30/20  6:42 PM  Result Value Ref Range   Glucose-Capillary 82 70 - 99 mg/dL    Comment: Glucose reference range applies only to samples taken after fasting for at least 8 hours.  Glucose, capillary     Status: None   Collection Time: 05/30/20  7:55 PM  Result Value Ref Range   Glucose-Capillary 83 70 - 99 mg/dL    Comment: Glucose reference range applies only to samples taken after fasting for at least 8 hours.   Lactic acid, plasma     Status: Abnormal   Collection Time: 05/30/20  9:30 PM  Result Value Ref Range   Lactic Acid, Venous 2.8 (HH) 0.5 - 1.9 mmol/L    Comment: CRITICAL VALUE NOTED.  VALUE IS CONSISTENT WITH PREVIOUSLY REPORTED AND CALLED VALUE. Performed at [facility], [place] [address] [place], [place] [address]   Glucose, capillary     Status: None   Collection Time: 05/30/20 11:12 PM  Result Value Ref Range   Glucose-Capillary 95 70 - 99 mg/dL    Comment: Glucose reference range applies only to samples taken after fasting for at least 8 hours.  Glucose, capillary     Status: Abnormal   Collection Time: 05/31/20  2:57 AM  Result Value Ref Range   Glucose-Capillary 112 (H) 70 - 99 mg/dL    Comment: Glucose reference range applies only to samples taken after fasting for at least 8 hours.  Comprehensive metabolic panel     Status: Abnormal   Collection Time: 05/31/20  3:08 AM  Result Value Ref Range   Sodium 141 135 - 145 mmol/L   Potassium 3.9 3.5 - 5.1 mmol/L   Chloride 114 (H) 98 - 111 mmol/L   CO2 22 22 - 32 mmol/L   Glucose, Bld 117 (H) 70 - 99 mg/dL    Comment: Glucose reference range applies only to samples taken after fasting for at least 8 hours.   BUN 7 6 - 20 mg/dL   Creatinine, Ser 0.90 0.44 - 1.00 mg/dL   Calcium 8.3 (L) 8.9 - 10.3 mg/dL   Total Protein 5.4 (L) 6.5 - 8.1 g/dL   Albumin 3.0 (L) 3.5 - 5.0 g/dL   AST 19 15 - 41 U/L   ALT 22 0 - 44 U/L   Alkaline Phosphatase 34 (L) 38 - 126 U/L   Total Bilirubin 1.3 (H) 0.3 - 1.2 mg/dL   GFR, Estimated >60 >60 mL/min    Comment: (NOTE) Calculated using the CKD-EPI Creatinine Equation (2021)    Anion gap 5 5 - 15    Comment: Performed at [facility], [place] [address], [place], [place] [address]  Procalcitonin     Status: None   Collection Time: 05/31/20  3:08 AM  Result Value Ref Range  Procalcitonin 0.20 ng/mL    Comment:        Interpretation: PCT (Procalcitonin) <= 0.5 ng/mL: Systemic  infection (sepsis) is not likely. Local bacterial infection is possible. (NOTE)       Sepsis PCT Algorithm           Lower Respiratory Tract                                      Infection PCT Algorithm    ----------------------------     ----------------------------         PCT < 0.25 ng/mL                PCT < 0.10 ng/mL          Strongly encourage             Strongly discourage   discontinuation of antibiotics    initiation of antibiotics    ----------------------------     -----------------------------       PCT 0.25 - 0.50 ng/mL            PCT 0.10 - 0.25 ng/mL               OR       >80% decrease in PCT            Discourage initiation of                                            antibiotics      Encourage discontinuation           of antibiotics    ----------------------------     -----------------------------         PCT >= 0.50 ng/mL              PCT 0.26 - 0.50 ng/mL               AND        <80% decrease in PCT             Encourage initiation of                                             antibiotics       Encourage continuation           of antibiotics    ----------------------------     -----------------------------        PCT >= 0.50 ng/mL                  PCT > 0.50 ng/mL               AND         increase in PCT                  Strongly encourage                                      initiation of antibiotics    Strongly encourage escalation           of antibiotics                                     -----------------------------  PCT <= 0.25 ng/mL                                                 OR                                        > 80% decrease in PCT                                      Discontinue / Do not initiate                                             antibiotics  Performed at [facility], [place] [address], [place], [place] [address]   CBC     Status: Abnormal   Collection Time: 05/31/20  3:59  AM  Result Value Ref Range   WBC 13.2 (H) 4.0 - 10.5 K/uL   RBC 4.68 3.87 - 5.11 MIL/uL   Hemoglobin 12.5 12.0 - 15.0 g/dL   HCT 39.6 36.0 - 46.0 %   MCV 84.6 80.0 - 100.0 fL   MCH 26.7 26.0 - 34.0 pg   MCHC 31.6 30.0 - 36.0 g/dL   RDW 13.7 11.5 - 15.5 %   Platelets 139 (L) 150 - 400 K/uL   nRBC 0.0 0.0 - 0.2 %    Comment: Performed at [facility], [place] [address], [place], [place] [address]  Urinalysis, Routine w reflex microscopic Urine, Catheterized     Status: None   Collection Time: 05/31/20  7:31 AM  Result Value Ref Range   Color, Urine YELLOW YELLOW   APPearance CLEAR CLEAR   Specific Gravity, Urine 1.021 1.005 - 1.030   pH 6.0 5.0 - 8.0   Glucose, UA NEGATIVE NEGATIVE mg/dL   Hgb urine dipstick NEGATIVE NEGATIVE   Bilirubin Urine NEGATIVE NEGATIVE   Ketones, ur NEGATIVE NEGATIVE mg/dL   Protein, ur NEGATIVE NEGATIVE mg/dL   Nitrite NEGATIVE NEGATIVE   Leukocytes,Ua NEGATIVE NEGATIVE    Comment: Performed at [facility] [address] [place],  [address]  Glucose, capillary     Status: None   Collection Time: 05/31/20  7:40 AM  Result Value Ref Range   Glucose-Capillary 76 70 - 99 mg/dL    Comment: Glucose reference range applies only to samples taken after fasting for at least 8 hours.  Glucose, capillary     Status: Abnormal   Collection Time: 05/31/20 11:17 AM  Result Value Ref Range   Glucose-Capillary 109 (H) 70 - 99 mg/dL    Comment: Glucose reference range applies only to samples taken after fasting for at least 8 hours.    Current Facility-Administered Medications  Medication Dose Route Frequency Provider Last Rate Last Admin  . acetaminophen (TYLENOL) suppository 650 mg  650 mg Rectal Q6H PRN Anders Simmonds, MD   650 mg at 05/30/20 2045  . albuterol (PROVENTIL) (2.5 MG/3ML) 0.083% nebulizer solution 2.5 mg  2.5 mg Nebulization Q6H PRN Norval Morton, MD      .  Ampicillin-Sulbactam (UNASYN) 3 g in sodium chloride 0.9 % 100 mL IVPB   3 g Intravenous Q6H Chand, Currie Paris, MD 200 mL/hr at 05/31/20 1200 Infusion Verify at 05/31/20 1200  . chlorhexidine gluconate (MEDLINE KIT) (PERIDEX) 0.12 % solution 15 mL  15 mL Mouth Rinse BID Jennelle Human B, NP   15 mL at 05/31/20 0738  . Chlorhexidine Gluconate Cloth 2 % PADS 6 each  6 each Topical Q0600 Agarwala, Ravi, MD      . docusate sodium (COLACE) capsule 100 mg  100 mg Oral BID Jacky Kindle, MD      . enoxaparin (LOVENOX) injection 55 mg  55 mg Subcutaneous Q24H Henri Medal, RPH      . folic acid (FOLVITE) tablet 1 mg  1 mg Oral Daily Jacky Kindle, MD   1 mg at 05/31/20 1157  . MEDLINE mouth rinse  15 mL Mouth Rinse 10 times per day Jennelle Human B, NP   15 mL at 05/31/20 1157  . multivitamin with minerals tablet 1 tablet  1 tablet Oral Daily Jacky Kindle, MD   1 tablet at 05/31/20 1157  . [START ON 06/01/2020] polyethylene glycol (MIRALAX / GLYCOLAX) packet 17 g  17 g Oral Daily Chand, Sudham, MD      . sodium chloride flush (NS) 0.9 % injection 3 mL  3 mL Intravenous Q12H Smith, Rondell A, MD   3 mL at 05/31/20 0935  . [START ON 06/01/2020] thiamine tablet 100 mg  100 mg Oral Daily Jacky Kindle, MD        Musculoskeletal: Strength & Muscle Tone: within normal limits Gait & Station: unable to stand Patient leans: N/A            Psychiatric Specialty Exam:  Presentation  General Appearance: Disheveled  Eye Contact:Poor  Speech:Slurred; Slow  Speech Volume:Decreased  Handedness:Right   Mood and Affect  Mood:Dysphoric  Affect:Depressed; Constricted; Flat   Thought Process  Thought Processes:Linear  Descriptions of Associations:Intact  Orientation:Full (Time, Place and Person)  Thought Content:Logical  History of Schizophrenia/Schizoaffective disorder:No data recorded Duration of Psychotic Symptoms:No data recorded Hallucinations:Hallucinations: None  Ideas of Reference:None  Suicidal Thoughts:Suicidal Thoughts: Yes, Active SI Active  Intent and/or Plan: With Intent; With Means to Carry Out; With Access to Means  Homicidal Thoughts:Homicidal Thoughts: No   Sensorium  Memory:Recent Poor; Remote Fair; Immediate Poor  Judgment:Impaired  Insight:Shallow   Executive Functions  Concentration:No data recorded Attention Span:Poor  [place]   Psychomotor Activity  Psychomotor Activity:Psychomotor Activity: Decreased; Other (comment) (stiffness)   Assets  Assets:Housing; Social Support; Resilience; Physical Health   Sleep  Sleep:Sleep: Poor   Physical Exam: Physical Exam Vitals and nursing note reviewed.  Constitutional:      General: She is sleeping.     Appearance: She is obese.  HENT:     Mouth/Throat:     Mouth: Mucous membranes are dry.     Comments: Dry mouth and lips, sticking together Skin:    Findings: Erythema present.          Comments: multiple red streaks on anterior forearms, erythema noted to hands. Erythema noted on bilaterally to cheeks   Neurological:     General: No focal deficit present.     Mental Status: She is oriented to person, place, and time and easily aroused. She is lethargic.  Psychiatric:        Attention and Perception: Attention and perception normal.  Mood and Affect: Mood is depressed.        Speech: Speech is slurred.        Behavior: Behavior is slowed.        Thought Content: Thought content includes suicidal ideation.        Cognition and Memory: Cognition normal.    Review of Systems  Psychiatric/Behavioral: Positive for depression and suicidal ideas. The patient is nervous/anxious and has insomnia.    Blood pressure 116/72, pulse 95, temperature 98.3 F (36.8 C), temperature source Oral, resp. rate 16, height _0  (1.854 m), weight 109.4 kg, last menstrual period 05/29/2020, SpO2 96 %. Body mass index is 31.82 kg/m.  Treatment Plan Summary: Plan Recommend psychiatric inpatient admission once  medically stable.  Patient appears to be interested in inpatient admission, however after speaking with nurse patient has been requesting to go home.  Will recommend initiation of IVC, to ensure patient safety.  Would not recommend resuming home medications at this time, until she is medically stable.  Patient remains at high risk for anticholinergic toxicity and serotonin syndrome due to acute ingestion of multiple psychotropic medications.   -Recommend providing resources for IOP/PHP, as patient will continue to experience loss by separation from her wife.  She continues to display difficulty with emotional regulation, attachment issues will benefit from intensive therapy after hospitalization.  Disposition: Recommend psychiatric Inpatient admission when medically cleared.  Suella Broad, FNP 05/31/2020 1:18 PM

## 2020-06-01 DIAGNOSIS — F332 Major depressive disorder, recurrent severe without psychotic features: Secondary | ICD-10-CM | POA: Diagnosis not present

## 2020-06-01 LAB — BASIC METABOLIC PANEL
Anion gap: 4 — ABNORMAL LOW (ref 5–15)
BUN: 6 mg/dL (ref 6–20)
CO2: 23 mmol/L (ref 22–32)
Calcium: 8.5 mg/dL — ABNORMAL LOW (ref 8.9–10.3)
Chloride: 112 mmol/L — ABNORMAL HIGH (ref 98–111)
Creatinine, Ser: 0.74 mg/dL (ref 0.44–1.00)
GFR, Estimated: >60 mL/min (ref >60)
Glucose, Bld: 114 mg/dL — ABNORMAL HIGH (ref 70–99)
Potassium: 3.5 mmol/L (ref 3.5–5.1)
Sodium: 139 mmol/L (ref 135–145)

## 2020-06-01 LAB — CBC
HCT: 35.1 % — ABNORMAL LOW (ref 36.0–46.0)
Hemoglobin: 11.3 g/dL — ABNORMAL LOW (ref 12.0–15.0)
MCH: 26.9 pg (ref 26.0–34.0)
MCHC: 32.2 g/dL (ref 30.0–36.0)
MCV: 83.6 fL (ref 80.0–100.0)
Platelets: 125 10*3/uL — ABNORMAL LOW (ref 150–400)
RBC: 4.2 MIL/uL (ref 3.87–5.11)
RDW: 13.2 % (ref 11.5–15.5)
WBC: 8 10*3/uL (ref 4.0–10.5)
nRBC: 0 % (ref 0.0–0.2)

## 2020-06-01 LAB — GLUCOSE, CAPILLARY
Glucose-Capillary: 93 mg/dL (ref 70–99)
Glucose-Capillary: 113 mg/dL — ABNORMAL HIGH (ref 70–99)
Glucose-Capillary: 101 mg/dL — ABNORMAL HIGH (ref 70–99)
Glucose-Capillary: 70 mg/dL (ref 70–99)
Glucose-Capillary: 71 mg/dL (ref 70–99)
Glucose-Capillary: 97 mg/dL (ref 70–99)

## 2020-06-01 LAB — TSH: TSH: 2.846 u[IU]/mL (ref 0.350–4.500)

## 2020-06-01 LAB — HEMOGLOBIN A1C
Hgb A1c MFr Bld: 5.3 % (ref 4.8–5.6)
Mean Plasma Glucose: 105.41 mg/dL

## 2020-06-01 LAB — PROCALCITONIN: Procalcitonin: 0.13 ng/mL

## 2020-06-01 LAB — MAGNESIUM: Magnesium: 1.9 mg/dL (ref 1.7–2.4)

## 2020-06-01 MED ORDER — LORAZEPAM 2 MG/ML IJ SOLN
1.0000 mg | Freq: Four times a day (QID) | INTRAMUSCULAR | Status: DC | PRN
  Administered 2020-06-02 (×2): 1 mg via INTRAVENOUS
  Filled 2020-06-01 (×2): qty 1

## 2020-06-01 MED ORDER — THIAMINE HCL 100 MG/ML IJ SOLN
100.0000 mg | Freq: Every day | INTRAMUSCULAR | Status: DC
  Administered 2020-06-01: 100 mg via INTRAVENOUS
  Filled 2020-06-01: qty 2

## 2020-06-01 MED ORDER — DEXTROSE IN LACTATED RINGERS 5 % IV SOLN: INTRAVENOUS | Status: AC

## 2020-06-01 MED ORDER — DEXMEDETOMIDINE HCL IN NACL 400 MCG/100ML IV SOLN
0.2000 ug/kg/h | INTRAVENOUS | Status: DC
  Administered 2020-06-01: 0.2 ug/kg/h via INTRAVENOUS
  Filled 2020-06-01 (×2): qty 100

## 2020-06-01 MED ORDER — DIAZEPAM 5 MG/ML IJ SOLN
2.5000 mg | Freq: Once | INTRAMUSCULAR | Status: AC
  Administered 2020-06-01: 2.5 mg via INTRAVENOUS
  Filled 2020-06-01: qty 2

## 2020-06-01 MED ORDER — PANTOPRAZOLE SODIUM 40 MG IV SOLR
40.0000 mg | INTRAVENOUS | Status: DC
  Administered 2020-06-01: 40 mg via INTRAVENOUS
  Filled 2020-06-01: qty 40

## 2020-06-01 MED ORDER — LORAZEPAM 0.5 MG PO TABS
0.5000 mg | ORAL_TABLET | Freq: Two times a day (BID) | ORAL | Status: DC
  Administered 2020-06-01 – 2020-06-03 (×5): 0.5 mg via ORAL
  Filled 2020-06-01 (×5): qty 1

## 2020-06-01 MED ORDER — LORAZEPAM 2 MG/ML IJ SOLN
1.0000 mg | INTRAMUSCULAR | Status: DC | PRN
  Administered 2020-06-01: 2 mg via INTRAVENOUS
  Filled 2020-06-01: qty 1

## 2020-06-01 NOTE — Progress Notes (Signed)
OT Cancellation Note  Patient Details Name: Ashley Valencia MRN: [id number] DOB: 1990-11-18   Cancelled Treatment:    Reason Eval/Treat Not Completed: Patient not medically ready (Pt very drowsy and unable to participate fully  without eyes closing. Pt would be more appropraite to be seen tomorrow for OT eval.)   Flora Lipps, OTR/L Acute Rehabilitation Services Pager: [phone] Office: [phone]  Ashley Valencia 06/01/2020, 4:04 PM

## 2020-06-01 NOTE — Progress Notes (Signed)
Pt bed alarm sounded, responded to pt room with other nursing staff. Pt sitting on the floor, bedrails still up. Per pt safety attendant pt climbed over side rail. Pt states "Im trying to get the rabbit out of the cabinet." Critical care PA at bedside. Pt VS stable, no complaints of pain. No obvious s/s of injury. Pt placed back in bed, PRN medications ordered by PA.

## 2020-06-01 NOTE — Progress Notes (Signed)
SLP Cancellation Note  Patient Details Name: Ashley Valencia MRN: [id number] DOB: 1990-08-05   Cancelled treatment:       Reason Eval/Treat Not Completed: Fatigue/lethargy limiting ability to participate- sedated.  Roused after bath and took a few sips of her drink without concerns for aspiration per RN and NT. SLP will follow as MS improves.   Amanda L. [name], MA CCC/SLP Acute Rehabilitation Services Office number [phone] Pager [phone]    Ashley Valencia 06/01/2020, 10:50 AM

## 2020-06-01 NOTE — Progress Notes (Signed)
Rehab Admissions Coordinator Note:  Patient was screened by Clois Dupes for appropriateness for an Inpatient Acute Rehab Consult per therapy recs. Noted patient on Precedex. She is not yet alert enough to assess for candidacy for CIR admit. She is also IVC for possible psych placement. We will follow her progress from a distance and I will not place rehab consult at this time.   Clois Dupes RN MSN 06/01/2020, 12:45 PM  I can be reached at [phone].

## 2020-06-01 NOTE — Progress Notes (Signed)
PROGRESS NOTE                                                                                                                                                                                                             Patient Demographics:    Ashley Valencia, is a 30 y.o. female, DOB - 10/24/1990, UDJ:[id number]  Outpatient Primary MD for the patient is Soundra Pilon, FNP    LOS - 2  Admit date - 05/29/2020    Chief Complaint  Patient presents with  . Suicide Attempt       Brief Narrative (HPI from H&P) - 30 year old female with history of major depression who was admitted with altered mental status due to drug overdose in an attempt of suicide, a work-up was consistent with suicidal ideation, aspiration pneumonia, respiratory failure, she was seen by PCCM and psych, psych has recommended inpatient psych admission after she is medically cleared, she was transferred to my service on 06/01/2020 on day 2 of her hospital stay on Precedex drip, patient currently extremely somnolent unable to answer questions or follow commands.  Previous team discussed her case with patient's legal wife Albin Felling [phone], patient was involuntarily admitted to a psych hospital in October 2021 for suicidal ideations.  Suspected pill intake was  Xanax- 12 (0.5mg )- (verified with pharmacy she did fill 90 day supply 3/12) Pantoprazole 40mg - unknown amount taken Fluoxetine 50mg - unknown amount taken solifenacin 10mg - unknown amount taken ariprezole 10mg - unknown amount taken Beer- 2    Subjective:    today in bed, sedated under the effect of medications, appears to be in no distress, unable to answer questions or follow commands reliably.   Assessment  & Plan :     1.  Attempted suicide with intentional polypharmacy overdose in a patient with history of same in the recent past, depression - psych following, currently depression  medications on hold due to prolonged QTC, once medically stable per psych to report to inpatient psych hospital.   2.  Severe metabolic encephalopathy and agitation.  Placed on Precedex drip last night by ICU team, for now continue as needed IV Ativan for any breakthrough seizures and agitation.  Avoiding Haldol due to QTC for recommendations.  Continue IV fluids for hydration.  3.  Acute hypoxic respiratory failure due to aspiration pneumonia.  Continue Unasyn, sepsis  pathophysiology has resolved, hypoxia has improved.  Continue supplemental oxygen.  4. Prolonged QTc - stable electrolytes, hold offending psych medications, daily EKG, on telemetry.       Condition - Extremely Guarded  Family Communication  : legal wife Albin Felling [phone] - 06/01/20  Code Status :  Full  Consults  :  Psych, PCCM  PUD Prophylaxis : PPI   Procedures  :            Disposition Plan  :    Status is: Inpatient  Remains inpatient appropriate because:IV treatments appropriate due to intensity of illness or inability to take PO   Dispo: The patient is from: Home              Anticipated d/c is to: [facility]              Patient currently is not medically stable to d/c.   Difficult to place patient No  DVT Prophylaxis  :  Lovenox    Lab Results  Component Value Date   PLT 125 (L) 06/01/2020    Diet :  Diet Order            Diet full liquid Room service appropriate? Yes; Fluid consistency: Thin  Diet effective now                  Inpatient Medications  Scheduled Meds: . chlorhexidine  15 mL Mouth Rinse BID  . Chlorhexidine Gluconate Cloth  6 each Topical Q0600  . docusate sodium  100 mg Oral BID  . enoxaparin (LOVENOX) injection  55 mg Subcutaneous Q24H  . folic acid  1 mg Oral Daily  . mouth rinse  15 mL Mouth Rinse q12n4p  . multivitamin with minerals  1 tablet Oral Daily  . sodium chloride flush  3 mL Intravenous Q12H  . thiamine injection  100 mg Intravenous Daily   Continuous  Infusions: . ampicillin-sulbactam (UNASYN) IV 3 g (06/01/20 0506)  . dexmedetomidine (PRECEDEX) IV infusion 0.2 mcg/kg/hr (06/01/20 0323)  . dextrose 5% lactated ringers     PRN Meds:.acetaminophen, albuterol, LORazepam  Antibiotics  :    Anti-infectives (From admission, onward)   Start     Dose/Rate Route Frequency Ordered Stop   05/31/20 1215  Ampicillin-Sulbactam (UNASYN) 3 g in sodium chloride 0.9 % 100 mL IVPB        3 g 200 mL/hr over 30 Minutes Intravenous Every 6 hours 05/31/20 1118     05/30/20 1700  Ampicillin-Sulbactam (UNASYN) 3 g in sodium chloride 0.9 % 100 mL IVPB  Status:  Discontinued        3 g 200 mL/hr over 30 Minutes Intravenous Every 6 hours 05/30/20 1540 05/31/20 0845   05/30/20 1500  Ampicillin-Sulbactam (UNASYN) 3 g in sodium chloride 0.9 % 100 mL IVPB  Status:  Discontinued        3 g 200 mL/hr over 30 Minutes Intravenous Every 6 hours 05/30/20 1451 05/30/20 1540       Time Spent in minutes  30   Susa Raring M.D on 06/01/2020 at 9:12 AM  To page go to [email or web address]   [facility] -  Office  [phone]    See all Orders from today for further details    Objective:   Vitals:   06/01/20 0333 06/01/20 0400 06/01/20 0600 06/01/20 0749  BP:  (!) 137/95 (!) 143/81   Pulse:  71 71 66  Resp:  (!) 34 (!) 32 (!) 22  Temp: 98.4 F (  36.9 C)   98.5 F (36.9 C)  TempSrc: Oral   Axillary  SpO2:  96% 92% 94%  Weight:      Height:        Wt Readings from Last 3 Encounters:  05/31/20 109.4 kg  01/01/20 107 kg  01/01/20 98 kg     Intake/Output Summary (Last 24 hours) at 06/01/2020 0912 Last data filed at 06/01/2020 0857 Gross per 24 hour  Intake 809.17 ml  Output 785 ml  Net 24.17 ml     Physical Exam  Somnolent on Precidex drip, moves all 4 extremities to painful stimuli [place].AT,PERRAL Supple Neck,No JVD, No cervical lymphadenopathy appriciated.  Symmetrical Chest wall movement, Good air movement bilaterally, CTAB RRR,No  Gallops,Rubs or new Murmurs, No Parasternal Heave +ve B.Sounds, Abd Soft, No tenderness, No organomegaly appriciated, No rebound - guarding or rigidity. No Cyanosis, Clubbing or edema, No new Rash or bruise      Data Review:    CBC Recent Labs  Lab 05/29/20 2248 05/30/20 1556 05/31/20 0359 06/01/20 0823  WBC 7.4  --  13.2* 8.0  HGB 14.1 14.3 12.5 11.3*  HCT 42.8 42.0 39.6 35.1*  PLT 205  --  139* 125*  MCV 82.1  --  84.6 83.6  MCH 27.1  --  26.7 26.9  MCHC 32.9  --  31.6 32.2  RDW 13.2  --  13.7 13.2    Recent Labs  Lab 05/29/20 0013 05/29/20 2248 05/30/20 0805 05/30/20 1556 05/30/20 1824 05/30/20 2130 05/31/20 0308 06/01/20 0142 06/01/20 0823  NA  --  137 140 143  --   --  141  --   --   K  --  3.4* 3.9 3.8  --   --  3.9  --   --   CL  --  108 108  --   --   --  114*  --   --   CO2  --  19* 23  --   --   --  22  --   --   GLUCOSE  --  127* 130*  --   --   --  117*  --   --   BUN  --  10 9  --   --   --  7  --   --   CREATININE  --  0.72 0.82  --   --   --  0.90  --   --   CALCIUM  --  9.2 8.8*  --   --   --  8.3*  --   --   AST  --  31  --   --   --   --  19  --   --   ALT  --  34  --   --   --   --  22  --   --   ALKPHOS  --  43  --   --   --   --  34*  --   --   BILITOT  --  1.0  --   --   --   --  1.3*  --   --   ALBUMIN  --  4.2  --   --   --   --  3.0*  --   --   MG 2.0  --   --   --   --   --   --   --   --   PROCALCITON  --   --   --   --  <  0.10  --  0.20 0.13  --   LATICACIDVEN  --   --   --   --  5.1* 2.8*  --   --   --   HGBA1C  --   --   --   --   --   --   --   --  5.3    ------------------------------------------------------------------------------------------------------------------ No results for input(s): CHOL, HDL, LDLCALC, TRIG, CHOLHDL, LDLDIRECT in the last 72 hours.  Lab Results  Component Value Date   HGBA1C 5.3 06/01/2020    ------------------------------------------------------------------------------------------------------------------ No results for input(s): TSH, T4TOTAL, T3FREE, THYROIDAB in the last 72 hours.  Invalid input(s): FREET3  Cardiac Enzymes No results for input(s): CKMB, TROPONINI, MYOGLOBIN in the last 168 hours.  Invalid input(s): CK ------------------------------------------------------------------------------------------------------------------ No results found for: BNP  Micro Results Recent Results (from the past 240 hour(s))  Resp Panel by RT-PCR (Flu A&B, Covid) Nasopharyngeal Swab     Status: None   Collection Time: 05/30/20 12:54 AM   Specimen: Nasopharyngeal Swab; Nasopharyngeal(NP) swabs in vial transport medium  Result Value Ref Range Status   SARS Coronavirus 2 by RT PCR NEGATIVE NEGATIVE Final    Comment: (NOTE) SARS-CoV-2 target nucleic acids are NOT DETECTED.  The SARS-CoV-2 RNA is generally detectable in upper respiratory specimens during the acute phase of infection. The lowest concentration of SARS-CoV-2 viral copies this assay can detect is 138 copies/mL. A negative result does not preclude SARS-Cov-2 infection and should not be used as the sole basis for treatment or other patient management decisions. A negative result may occur with  improper specimen collection/handling, submission of specimen other than nasopharyngeal swab, presence of viral mutation(s) within the areas targeted by this assay, and inadequate number of viral copies(<138 copies/mL). A negative result must be combined with clinical observations, patient history, and epidemiological information. The expected result is Negative.  Fact Sheet for Patients:  BloggerCourse.com  Fact Sheet for Healthcare Providers:  SeriousBroker.it  This test is no t yet approved or cleared by the [place] FDA and  has been authorized for detection and/or  diagnosis of SARS-CoV-2 by FDA under an Emergency Use Authorization (EUA). This EUA will remain  in effect (meaning this test can be used) for the duration of the COVID-19 declaration under Section 564(b)(1) of the Act, 21 U.S.C.section 360bbb-3(b)(1), unless the authorization is terminated  or revoked sooner.       Influenza A by PCR NEGATIVE NEGATIVE Final   Influenza B by PCR NEGATIVE NEGATIVE Final    Comment: (NOTE) The Xpert Xpress SARS-CoV-2/FLU/RSV plus assay is intended as an aid in the diagnosis of influenza from Nasopharyngeal swab specimens and should not be used as a sole basis for treatment. Nasal washings and aspirates are unacceptable for Xpert Xpress SARS-CoV-2/FLU/RSV testing.  Fact Sheet for Patients: BloggerCourse.com  Fact Sheet for Healthcare Providers: SeriousBroker.it  This test is not yet approved or cleared by the [place] FDA and has been authorized for detection and/or diagnosis of SARS-CoV-2 by FDA under an Emergency Use Authorization (EUA). This EUA will remain in effect (meaning this test can be used) for the duration of the COVID-19 declaration under Section 564(b)(1) of the Act, 21 U.S.C. section 360bbb-3(b)(1), unless the authorization is terminated or revoked.  Performed at [facility], [address] [place], [place] [address]   MRSA PCR Screening     Status: None   Collection Time: 05/30/20  3:54 PM   Specimen: Nasopharyngeal  Result Value Ref Range  Status   MRSA by PCR NEGATIVE NEGATIVE Final    Comment:        The GeneXpert MRSA Assay (FDA approved for NASAL specimens only), is one component of a comprehensive MRSA colonization surveillance program. It is not intended to diagnose MRSA infection nor to guide or monitor treatment for MRSA infections. Performed at [facility], [address] [place], [place] [address]   Culture, blood (routine x 2)     Status:  None (Preliminary result)   Collection Time: 05/31/20  9:10 AM   Specimen: BLOOD LEFT HAND  Result Value Ref Range Status   Specimen Description BLOOD LEFT HAND  Final   Special Requests   Final    BOTTLES DRAWN AEROBIC AND ANAEROBIC Blood Culture adequate volume   Culture   Final    NO GROWTH < 24 HOURS Performed at [facility], [address], [place], [place] [address]    Report Status PENDING  Incomplete  Culture, blood (routine x 2)     Status: None (Preliminary result)   Collection Time: 05/31/20  9:10 AM   Specimen: BLOOD RIGHT HAND  Result Value Ref Range Status   Specimen Description BLOOD RIGHT HAND  Final   Special Requests   Final    BOTTLES DRAWN AEROBIC AND ANAEROBIC Blood Culture adequate volume   Culture   Final    NO GROWTH < 24 HOURS Performed at [facility], [address], [place], [place] [address]    Report Status PENDING  Incomplete    Radiology Reports DG CHEST PORT 1 VIEW  Result Date: 05/31/2020 CLINICAL DATA:  Hypoxia. EXAM: PORTABLE CHEST 1 VIEW COMPARISON:  May 30, 2020. FINDINGS: The heart size and mediastinal contours are within normal limits. No pneumothorax or pleural effusion is noted. Stable bilateral lower lobe opacities are noted concerning for pneumonia or possibly edema. The visualized skeletal structures are unremarkable. IMPRESSION: Stable bilateral lower lobe opacities are noted concerning for pneumonia or possibly edema. Electronically Signed   By: Lupita Raider M.D.   On: 05/31/2020 08:55   DG Chest Port 1 View  Result Date: 05/30/2020 CLINICAL DATA:  Aspiration, drug overdose. EXAM: PORTABLE CHEST 1 VIEW COMPARISON:  None. FINDINGS: The heart size and mediastinal contours are within normal limits. Bilateral lower lung predominant airspace opacities are noted. A left pleural effusion may contribute. There is no right pleural effusion. There is no pneumothorax. The visualized skeletal structures are unremarkable.  IMPRESSION: Bilateral lower lung predominant airspace opacities likely represent aspiration. Electronically Signed   By: Romona Curls M.D.   On: 05/30/2020 14:56

## 2020-06-01 NOTE — Evaluation (Signed)
Physical Therapy Evaluation Patient Details Name: Ashley Valencia MRN: [id number] DOB: [birth year] Today's Date: 06/01/2020   History of Present Illness  30 year old female with history of major depression who was admitted with altered mental status due to drug overdose in an attempt of suicide, a work-up was consistent with suicidal ideation, aspiration pneumonia, respiratory failure and metabolic encephalopathy.Psych has recommended inpatient psych admission after she is medically cleared.  Was in Inpt psych October 2021.  Clinical Impression  Pt admitted with above diagnosis. Pt was able to sit EOB with min guard assist for safety due to lethargy and transfer to 3N1 and back to bed with mod assist due to extension of trunk and bil knees with standing.  Pt with impaired insight into her deficits and overall poor coordination of movement. Unsure of how much medications were affecting her ability to have safe movement today.  Will benefit from Rehab to return to prior level of function.   Pt currently with functional limitations due to the deficits listed below (see PT Problem List). Pt will benefit from skilled PT to increase their independence and safety with mobility to allow discharge to the venue listed below.      Follow Up Recommendations CIR    Equipment Recommendations  Other (comment) (TBA)    Recommendations for Other Services Rehab consult     Precautions / Restrictions Precautions Precautions: Fall Restrictions Weight Bearing Restrictions: No      Mobility  Bed Mobility Overal bed mobility: Needs Assistance Bed Mobility: Supine to Sit     Supine to sit: Min guard;Min assist     General bed mobility comments: Needed assist to coordinate movement    Transfers Overall transfer level: Needs assistance Equipment used: 2 person hand held assist Transfers: Sit to/from Stand;Stand Pivot Transfers Sit to Stand: Mod assist;From elevated surface Stand pivot transfers: Mod  assist;From elevated surface       General transfer comment: Mod assist to power up and to steady once up as pt with significant posterior lean with extensor tone bil LES and trunk but was able to pivot to 3n1.  Cleaned self when done with cues and assist for posture.  Upon return to bed when pt got to bed, posterior lean onto bed insteady of flexing needing control for descent.  Ambulation/Gait             General Gait Details: Pt lethargic keeping eyes closed much of session therefore decided to perform transfers only today.  Stairs            Wheelchair Mobility    Modified Rankin (Stroke Patients Only)       Balance Overall balance assessment: Needs assistance Sitting-balance support: No upper extremity supported;Feet supported;Bilateral upper extremity supported Sitting balance-Leahy Scale: Poor Sitting balance - Comments: relies on Ues due to posterior lean - min guard by therapist Postural control: Posterior lean Standing balance support: Bilateral upper extremity supported;During functional activity Standing balance-Leahy Scale: Poor Standing balance comment: Needed bil Ue support and external support of mod assist due to posterior lean/trunk and knee extension.                             Pertinent Vitals/Pain Pain Assessment: No/denies pain    Home Living Family/patient expects to be discharged to:: Private residence Living Arrangements: Other relatives;Children;Spouse/significant other (sister, possible separation from wife) Available Help at Discharge: Family;Available PRN/intermittently Type of Home: Mobile home Home Access: Stairs to enter  Entrance Stairs-Rails: Left Entrance Stairs-Number of Steps: 3 Home Layout: One level Home Equipment: None      Prior Function Level of Independence: Independent         Comments: worked at [employer] full time     [employer]   Dominant Hand: Right    Extremity/Trunk Assessment   Upper  Extremity Assessment Upper Extremity Assessment: Defer to OT evaluation    Lower Extremity Assessment Lower Extremity Assessment: RLE deficits/detail;LLE deficits/detail RLE Coordination: decreased gross motor LLE Coordination: decreased gross motor    Cervical / Trunk Assessment Cervical / Trunk Assessment: Other exceptions (Extensor tone trunk/bil LEs in standing)  Communication   Communication: No difficulties (speaks in whisper)  Cognition Arousal/Alertness: Lethargic;Suspect due to medications Behavior During Therapy: Impulsive Overall Cognitive Status: Impaired/Different from baseline Area of Impairment: Orientation;Memory;Following commands;Safety/judgement;Awareness;Problem solving                 Orientation Level: Disoriented to;Situation   Memory: Decreased recall of precautions Following Commands: Follows one step commands with increased time Safety/Judgement: Decreased awareness of safety;Decreased awareness of deficits   Problem Solving: Difficulty sequencing;Requires verbal cues;Requires tactile cues        General Comments General comments (skin integrity, edema, etc.): VSS    Exercises     Assessment/Plan    PT Assessment Patient needs continued PT services  PT Problem List Decreased activity tolerance;Decreased balance;Decreased mobility;Decreased knowledge of use of DME;Decreased safety awareness;Decreased knowledge of precautions;Decreased strength;Decreased range of motion;Decreased coordination;Decreased cognition       PT Treatment Interventions DME instruction;Gait training;Stair training;Functional mobility training;Therapeutic activities;Therapeutic exercise;Balance training;Patient/family education    PT Goals (Current goals can be found in the Care Plan section)  Acute Rehab PT Goals Patient Stated Goal: to get back to work PT Goal Formulation: With patient Time For Goal Achievement: 06/15/20 Potential to Achieve Goals: Good     Frequency Min 3X/week   Barriers to discharge Decreased caregiver support      Co-evaluation               AM-PAC PT "6 Clicks" Mobility  Outcome Measure Help needed turning from your back to your side while in a flat bed without using bedrails?: A Little Help needed moving from lying on your back to sitting on the side of a flat bed without using bedrails?: A Little Help needed moving to and from a bed to a chair (including a wheelchair)?: A Lot Help needed standing up from a chair using your arms (e.g., wheelchair or bedside chair)?: A Lot Help needed to walk in hospital room?: Total Help needed climbing 3-5 steps with a railing? : Total 6 Click Score: 12    End of Session Equipment Utilized During Treatment: Gait belt Activity Tolerance: Patient limited by fatigue;Patient limited by lethargy Patient left: in bed;with call bell/phone within reach;with bed alarm set;with nursing/sitter in room (in chair position) Nurse Communication: Mobility status PT Visit Diagnosis: Unsteadiness on feet (R26.81);Muscle weakness (generalized) (M62.81)    Time: 1610-9604 PT Time Calculation (min) (ACUTE ONLY): 27 min   Charges:   PT Evaluation $PT Eval Moderate Complexity: 1 Mod PT Treatments $Therapeutic Activity: 8-22 mins        Kayton Dunaj M,PT Acute Rehab Services [phone] (pager)  Bevelyn Buckles 06/01/2020, 12:25 PM

## 2020-06-01 NOTE — Consult Note (Signed)
[facility] Face-to-Face Psychiatry Consult   Reason for Consult:  Suicide attempt by overdose Referring Physician:  Dr. Merrily Pew Patient Identification: Ashley Valencia MRN:  [id number] Principal Diagnosis: Severe recurrent major depression without psychotic features (HCC) Diagnosis:  Principal Problem:   Severe recurrent major depression without psychotic features (HCC) Active Problems:   Intentional drug overdose, initial encounter (HCC)   Overdose   Total Time spent with patient: 45 minutes  Subjective:   Ashley Valencia is a 30 y.o. female patient admitted with suicide attempt by overdose on multiple medications.  Patient is seen and assessed, she continues to minimize her most recent suicide attempt.  Unfortunately she does not have much insight into her most recent suicide attempt, and continues to live judgment.  As per patient" I need to get out so I can pay my car payment.  Why cannot not sign myself out of the hospital?  Last time I went to the psych hospital and did not do shit for me.  It was the worse 3 days of my life.  Can I signed out voluntary? "  Patient has no remorse about her actions, or regard for her safety, continues to show significant emotional dysregulation.  She does not agree with the decision to continue inpatient for psychiatric admission, and becomes tearful as she reports she has to go to work in order to pay her bills.  "  I am going to lose my car and my job if I do not get out of here soon.  On supposed to work today. "  She continues to endorse suicidal ideations, although passive.  She also endorses visual hallucinations most recent as of this morning.  It is noted she is previously on a Precedex drip, which was just discontinued moments prior to this psych evaluation.  She continues to be lethargic, although much more alert and oriented than yesterday.  Patient continues to meet criteria for inpatient admission.     HPI:  30 year old female with reported prior  history of depression, migraines, and bladder problems presented from home on 3/19 evening by EMS for intentional overdose by pills and ETOH. 3/20 developed respiratory distress in ED, transferred to ICU- now with asp pna. Per EMS run sheet, she was alert and oriented, vital signs stable.  Reported that she took the following pills around 2130 but unclear amount of pills taken:  Xanax- 12 (0.5mg )- (verified with pharmacy she did fill 90 day supply 3/12) Pantoprazole 40mg - unknown amount taken Fluvoxamine 50mg - unknown amount taken solifenacin 10mg - unknown amount taken ariprezole 10mg - unknown amount taken Beer- 2  EMS did note empty pill bottles that were filled one week ago, 90 day supplies.  She reportedly stated she was trying to kill herself due to impending divorce but voluntary came to ER.  Past Psychiatric History: Depression, previously being treated with fluoxetine and Abilify.  1 documented suicide attempt in October 2021, that resulted in only hospitalization at [facility].  No other suicide attempts noted.  Prior to receiving psychiatric services and mood treatment center, patient was receiving treatment for depression by primary care to include Abilify, Zoloft, and amitriptyline. Risk to Self:  Yes Risk to Others:  No Prior Inpatient Therapy:  [facility] in October 2021, for suicide attempt by overdose Prior Outpatient Therapy:  Was previously receiving services at mood treatment center.  Past Medical History:  Past Medical History:  Diagnosis Date  . History of bladder problems   . Migraines  Past Surgical History:  Procedure Laterality Date  . UMBILICAL HERNIA REPAIR     age 28   Family History:  Family History  Problem Relation Age of Onset  . Diabetes Mother   . Hypertension Mother   . Kidney failure Mother   . Diabetes Maternal Grandmother   . Hypertension Maternal Grandmother   . Kidney failure Maternal Grandmother   . Diabetes Maternal  Grandfather   . Hypertension Maternal Grandfather    Family Psychiatric  History: As per patient father diagnosed with bipolar schizophrenia Social History:  Social History   Substance and Sexual Activity  Alcohol Use Yes   Comment: rarely     Social History   Substance and Sexual Activity  Drug Use No    Social History   Socioeconomic History  . Marital status: Married    Spouse name: Not on file  . Number of children: Not on file  . Years of education: Not on file  . Highest education level: Not on file  Occupational History  . Not on file  Tobacco Use  . Smoking status: Never Smoker  . Smokeless tobacco: Never Used  Substance and Sexual Activity  . Alcohol use: Yes    Comment: rarely  . Drug use: No  . Sexual activity: Not on file  Other Topics Concern  . Not on file  Social History Narrative  . Not on file   Social Determinants of Health   Financial Resource Strain: Not on file  Food Insecurity: Not on file  Transportation Needs: Not on file  Physical Activity: Not on file  Stress: Not on file  Social Connections: Not on file   Additional Social History:    Allergies:  No Known Allergies  Labs:  Results for orders placed or performed during the hospital encounter of 05/29/20 (from the past 48 hour(s))  MRSA PCR Screening     Status: None   Collection Time: 05/30/20  3:54 PM   Specimen: Nasopharyngeal  Result Value Ref Range   MRSA by PCR NEGATIVE NEGATIVE    Comment:        The GeneXpert MRSA Assay (FDA approved for NASAL specimens only), is one component of a comprehensive MRSA colonization surveillance program. It is not intended to diagnose MRSA infection nor to guide or monitor treatment for MRSA infections. Performed at [facility], [address], [place], [place] [address]   I-STAT 7, (LYTES, BLD GAS, ICA, H+H)     Status: Abnormal   Collection Time: 05/30/20  3:56 PM  Result Value Ref Range   pH, Arterial 7.354 7.350 -  7.450   pCO2 arterial 32.1 32.0 - 48.0 mmHg   pO2, Arterial 75 (L) 83.0 - 108.0 mmHg   Bicarbonate 17.9 (L) 20.0 - 28.0 mmol/L   TCO2 19 (L) 22 - 32 mmol/L   O2 Saturation 94.0 %   Acid-base deficit 7.0 (H) 0.0 - 2.0 mmol/L   Sodium 143 135 - 145 mmol/L   Potassium 3.8 3.5 - 5.1 mmol/L   Calcium, Ion 1.26 1.15 - 1.40 mmol/L   HCT 42.0 36.0 - 46.0 %   Hemoglobin 14.3 12.0 - 15.0 g/dL   Sample type ARTERIAL   HIV Antibody (routine testing w rflx)     Status: None   Collection Time: 05/30/20  6:24 PM  Result Value Ref Range   HIV Screen 4th Generation wRfx Non Reactive Non Reactive    Comment: Performed at [facility], [address], [place], [place] [address]  Salicylate level     Status: Abnormal   Collection Time: 05/30/20  6:24 PM  Result Value Ref Range   Salicylate Lvl <7.0 (L) 7.0 - 30.0 mg/dL    Comment: Performed at [facility], [address] [place], [place] [address]  Lactic acid, plasma     Status: Abnormal   Collection Time: 05/30/20  6:24 PM  Result Value Ref Range   Lactic Acid, Venous 5.1 (HH) 0.5 - 1.9 mmol/L    Comment: CRITICAL RESULT CALLED TO, READ BACK BY AND VERIFIED WITH: S.VAVERITO,RN 05/30/2020 1919 DAVISB Performed at [facility], [address] [place], [place] [address]   Procalcitonin - Baseline     Status: None   Collection Time: 05/30/20  6:24 PM  Result Value Ref Range   Procalcitonin <0.10 ng/mL    Comment:        Interpretation: PCT (Procalcitonin) <= 0.5 ng/mL: Systemic infection (sepsis) is not likely. Local bacterial infection is possible. (NOTE)       Sepsis PCT Algorithm           Lower Respiratory Tract                                      Infection PCT Algorithm    ----------------------------     ----------------------------         PCT < 0.25 ng/mL                PCT < 0.10 ng/mL          Strongly encourage             Strongly discourage   discontinuation of antibiotics    initiation of antibiotics     ----------------------------     -----------------------------       PCT 0.25 - 0.50 ng/mL            PCT 0.10 - 0.25 ng/mL               OR       >80% decrease in PCT            Discourage initiation of                                            antibiotics      Encourage discontinuation           of antibiotics    ----------------------------     -----------------------------         PCT >= 0.50 ng/mL              PCT 0.26 - 0.50 ng/mL               AND        <80% decrease in PCT             Encourage initiation of                                             antibiotics       Encourage continuation           of antibiotics    ----------------------------     -----------------------------  PCT >= 0.50 ng/mL                  PCT > 0.50 ng/mL               AND         increase in PCT                  Strongly encourage                                      initiation of antibiotics    Strongly encourage escalation           of antibiotics                                     -----------------------------                                           PCT <= 0.25 ng/mL                                                 OR                                        > 80% decrease in PCT                                      Discontinue / Do not initiate                                             antibiotics  Performed at [facility], [address] [place], [place] [address]   Glucose, capillary     Status: None   Collection Time: 05/30/20  6:42 PM  Result Value Ref Range   Glucose-Capillary 82 70 - 99 mg/dL    Comment: Glucose reference range applies only to samples taken after fasting for at least 8 hours.  Glucose, capillary     Status: None   Collection Time: 05/30/20  7:55 PM  Result Value Ref Range   Glucose-Capillary 83 70 - 99 mg/dL    Comment: Glucose reference range applies only to samples taken after fasting for at least 8 hours.  Lactic acid, plasma     Status:  Abnormal   Collection Time: 05/30/20  9:30 PM  Result Value Ref Range   Lactic Acid, Venous 2.8 (HH) 0.5 - 1.9 mmol/L    Comment: CRITICAL VALUE NOTED.  VALUE IS CONSISTENT WITH PREVIOUSLY REPORTED AND CALLED VALUE. Performed at [facility], [address], [place], [place] [address]   Glucose, capillary     Status: None   Collection Time: 05/30/20 11:12 PM  Result Value Ref Range   Glucose-Capillary 95 70 - 99 mg/dL  Comment: Glucose reference range applies only to samples taken after fasting for at least 8 hours.  Glucose, capillary     Status: Abnormal   Collection Time: 05/31/20  2:57 AM  Result Value Ref Range   Glucose-Capillary 112 (H) 70 - 99 mg/dL    Comment: Glucose reference range applies only to samples taken after fasting for at least 8 hours.  Comprehensive metabolic panel     Status: Abnormal   Collection Time: 05/31/20  3:08 AM  Result Value Ref Range   Sodium 141 135 - 145 mmol/L   Potassium 3.9 3.5 - 5.1 mmol/L   Chloride 114 (H) 98 - 111 mmol/L   CO2 22 22 - 32 mmol/L   Glucose, Bld 117 (H) 70 - 99 mg/dL    Comment: Glucose reference range applies only to samples taken after fasting for at least 8 hours.   BUN 7 6 - 20 mg/dL   Creatinine, Ser 1.61 0.44 - 1.00 mg/dL   Calcium 8.3 (L) 8.9 - 10.3 mg/dL   Total Protein 5.4 (L) 6.5 - 8.1 g/dL   Albumin 3.0 (L) 3.5 - 5.0 g/dL   AST 19 15 - 41 U/L   ALT 22 0 - 44 U/L   Alkaline Phosphatase 34 (L) 38 - 126 U/L   Total Bilirubin 1.3 (H) 0.3 - 1.2 mg/dL   GFR, Estimated >09 >60 mL/min    Comment: (NOTE) Calculated using the CKD-EPI Creatinine Equation (2021)    Anion gap 5 5 - 15    Comment: Performed at [facility], [address], [place], [place] [address]  Procalcitonin     Status: None   Collection Time: 05/31/20  3:08 AM  Result Value Ref Range   Procalcitonin 0.20 ng/mL    Comment:        Interpretation: PCT (Procalcitonin) <= 0.5 ng/mL: Systemic infection (sepsis) is not likely. Local  bacterial infection is possible. (NOTE)       Sepsis PCT Algorithm           Lower Respiratory Tract                                      Infection PCT Algorithm    ----------------------------     ----------------------------         PCT < 0.25 ng/mL                PCT < 0.10 ng/mL          Strongly encourage             Strongly discourage   discontinuation of antibiotics    initiation of antibiotics    ----------------------------     -----------------------------       PCT 0.25 - 0.50 ng/mL            PCT 0.10 - 0.25 ng/mL               OR       >80% decrease in PCT            Discourage initiation of                                            antibiotics      Encourage discontinuation  of antibiotics    ----------------------------     -----------------------------         PCT >= 0.50 ng/mL              PCT 0.26 - 0.50 ng/mL               AND        <80% decrease in PCT             Encourage initiation of                                             antibiotics       Encourage continuation           of antibiotics    ----------------------------     -----------------------------        PCT >= 0.50 ng/mL                  PCT > 0.50 ng/mL               AND         increase in PCT                  Strongly encourage                                      initiation of antibiotics    Strongly encourage escalation           of antibiotics                                     -----------------------------                                           PCT <= 0.25 ng/mL                                                 OR                                        > 80% decrease in PCT                                      Discontinue / Do not initiate                                             antibiotics  Performed at [facility], [address] [place], [place] [address]   CBC     Status: Abnormal   Collection Time: 05/31/20  3:59 AM  Result Value Ref Range   WBC 13.2  (H) 4.0 - 10.5 K/uL  RBC 4.68 3.87 - 5.11 MIL/uL   Hemoglobin 12.5 12.0 - 15.0 g/dL   HCT 16.1 09.6 - 04.5 %   MCV 84.6 80.0 - 100.0 fL   MCH 26.7 26.0 - 34.0 pg   MCHC 31.6 30.0 - 36.0 g/dL   RDW 40.9 81.1 - 91.4 %   Platelets 139 (L) 150 - 400 K/uL   nRBC 0.0 0.0 - 0.2 %    Comment: Performed at [facility], [address] [place], [place] [address]  Urinalysis, Routine w reflex microscopic Urine, Catheterized     Status: None   Collection Time: 05/31/20  7:31 AM  Result Value Ref Range   Color, Urine YELLOW YELLOW   APPearance CLEAR CLEAR   Specific Gravity, Urine 1.021 1.005 - 1.030   pH 6.0 5.0 - 8.0   Glucose, UA NEGATIVE NEGATIVE mg/dL   Hgb urine dipstick NEGATIVE NEGATIVE   Bilirubin Urine NEGATIVE NEGATIVE   Ketones, ur NEGATIVE NEGATIVE mg/dL   Protein, ur NEGATIVE NEGATIVE mg/dL   Nitrite NEGATIVE NEGATIVE   Leukocytes,Ua NEGATIVE NEGATIVE    Comment: Performed at [facility], [address] [place], [place] [address]  Glucose, capillary     Status: None   Collection Time: 05/31/20  7:40 AM  Result Value Ref Range   Glucose-Capillary 76 70 - 99 mg/dL    Comment: Glucose reference range applies only to samples taken after fasting for at least 8 hours.  Culture, blood (routine x 2)     Status: None (Preliminary result)   Collection Time: 05/31/20  9:10 AM   Specimen: BLOOD LEFT HAND  Result Value Ref Range   Specimen Description BLOOD LEFT HAND    Special Requests      BOTTLES DRAWN AEROBIC AND ANAEROBIC Blood Culture adequate volume   Culture      NO GROWTH < 24 HOURS Performed at [facility], [address] [place], [place] [address]    Report Status PENDING   Culture, blood (routine x 2)     Status: None (Preliminary result)   Collection Time: 05/31/20  9:10 AM   Specimen: BLOOD RIGHT HAND  Result Value Ref Range   Specimen Description BLOOD RIGHT HAND    Special Requests      BOTTLES DRAWN AEROBIC AND ANAEROBIC Blood Culture  adequate volume   Culture      NO GROWTH < 24 HOURS Performed at [facility], [address], [place], [place] [address]    Report Status PENDING   Glucose, capillary     Status: Abnormal   Collection Time: 05/31/20 11:17 AM  Result Value Ref Range   Glucose-Capillary 109 (H) 70 - 99 mg/dL    Comment: Glucose reference range applies only to samples taken after fasting for at least 8 hours.  Glucose, capillary     Status: Abnormal   Collection Time: 05/31/20  3:55 PM  Result Value Ref Range   Glucose-Capillary 59 (L) 70 - 99 mg/dL    Comment: Glucose reference range applies only to samples taken after fasting for at least 8 hours.  Glucose, capillary     Status: Abnormal   Collection Time: 05/31/20  4:18 PM  Result Value Ref Range   Glucose-Capillary 55 (L) 70 - 99 mg/dL    Comment: Glucose reference range applies only to samples taken after fasting for at least 8 hours.  Glucose, capillary     Status: Abnormal   Collection Time: 05/31/20  4:45 PM  Result Value Ref Range   Glucose-Capillary 120 (H) 70 - 99 mg/dL    Comment: Glucose reference range applies only to samples taken after fasting for at least 8 hours.  Glucose, capillary     Status: Abnormal   Collection Time: 05/31/20  7:35 PM  Result Value Ref Range   Glucose-Capillary 54 (L) 70 - 99 mg/dL    Comment: Glucose reference range applies only to samples taken after fasting for at least 8 hours.  Glucose, capillary     Status: Abnormal   Collection Time: 05/31/20  8:05 PM  Result Value Ref Range   Glucose-Capillary 66 (L) 70 - 99 mg/dL    Comment: Glucose reference range applies only to samples taken after fasting for at least 8 hours.  Glucose, capillary     Status: None   Collection Time: 05/31/20 10:14 PM  Result Value Ref Range   Glucose-Capillary 96 70 - 99 mg/dL    Comment: Glucose reference range applies only to samples taken after fasting for at least 8 hours.  Procalcitonin     Status: None   Collection  Time: 06/01/20  1:42 AM  Result Value Ref Range   Procalcitonin 0.13 ng/mL    Comment:        Interpretation: PCT (Procalcitonin) <= 0.5 ng/mL: Systemic infection (sepsis) is not likely. Local bacterial infection is possible. (NOTE)       Sepsis PCT Algorithm           Lower Respiratory Tract                                      Infection PCT Algorithm    ----------------------------     ----------------------------         PCT < 0.25 ng/mL                PCT < 0.10 ng/mL          Strongly encourage             Strongly discourage   discontinuation of antibiotics    initiation of antibiotics    ----------------------------     -----------------------------       PCT 0.25 - 0.50 ng/mL            PCT 0.10 - 0.25 ng/mL               OR       >80% decrease in PCT            Discourage initiation of                                            antibiotics      Encourage discontinuation           of antibiotics    ----------------------------     -----------------------------         PCT >= 0.50 ng/mL              PCT 0.26 - 0.50 ng/mL               AND        <80% decrease in PCT             Encourage initiation of  antibiotics       Encourage continuation           of antibiotics    ----------------------------     -----------------------------        PCT >= 0.50 ng/mL                  PCT > 0.50 ng/mL               AND         increase in PCT                  Strongly encourage                                      initiation of antibiotics    Strongly encourage escalation           of antibiotics                                     -----------------------------                                           PCT <= 0.25 ng/mL                                                 OR                                        > 80% decrease in PCT                                      Discontinue / Do not initiate                                              antibiotics  Performed at [facility], [address], [place], [place] [address]   Glucose, capillary     Status: None   Collection Time: 06/01/20  3:19 AM  Result Value Ref Range   Glucose-Capillary 93 70 - 99 mg/dL    Comment: Glucose reference range applies only to samples taken after fasting for at least 8 hours.  Glucose, capillary     Status: Abnormal   Collection Time: 06/01/20  7:47 AM  Result Value Ref Range   Glucose-Capillary 113 (H) 70 - 99 mg/dL    Comment: Glucose reference range applies only to samples taken after fasting for at least 8 hours.  CBC     Status: Abnormal   Collection Time: 06/01/20  8:23 AM  Result Value Ref Range   WBC 8.0 4.0 - 10.5 K/uL   RBC 4.20 3.87 - 5.11 MIL/uL   Hemoglobin 11.3 (L) 12.0 - 15.0 g/dL   HCT 46.9 (L) 62.9 - 52.8 %  MCV 83.6 80.0 - 100.0 fL   MCH 26.9 26.0 - 34.0 pg   MCHC 32.2 30.0 - 36.0 g/dL   RDW 16.113.2 09.611.5 - 04.515.5 %   Platelets 125 (L) 150 - 400 K/uL   nRBC 0.0 0.0 - 0.2 %    Comment: Performed at [facility], [address] [place], [place] [address]  Basic metabolic panel     Status: Abnormal   Collection Time: 06/01/20  8:23 AM  Result Value Ref Range   Sodium 139 135 - 145 mmol/L   Potassium 3.5 3.5 - 5.1 mmol/L   Chloride 112 (H) 98 - 111 mmol/L   CO2 23 22 - 32 mmol/L   Glucose, Bld 114 (H) 70 - 99 mg/dL    Comment: Glucose reference range applies only to samples taken after fasting for at least 8 hours.   BUN 6 6 - 20 mg/dL   Creatinine, Ser 1.910.74 0.44 - 1.00 mg/dL   Calcium 8.5 (L) 8.9 - 10.3 mg/dL   GFR, Estimated >47>60 >82>60 mL/min    Comment: (NOTE) Calculated using the CKD-EPI Creatinine Equation (2021)    Anion gap 4 (L) 5 - 15    Comment: Performed at [facility], [address] [place], [place] [address]  Magnesium     Status: None   Collection Time: 06/01/20  8:23 AM  Result Value Ref Range   Magnesium 1.9 1.7 - 2.4 mg/dL    Comment: Performed at [facility], [address] [place], [place] [address]  Hemoglobin A1c     Status: None   Collection Time: 06/01/20  8:23 AM  Result Value Ref Range   Hgb A1c MFr Bld 5.3 4.8 - 5.6 %    Comment: (NOTE) Pre diabetes:          5.7%-6.4%  Diabetes:              >6.4%  Glycemic control for   <7.0% adults with diabetes    Mean Plasma Glucose 105.41 mg/dL    Comment: Performed at [facility], [address] [place], [place] [address]  TSH     Status: None   Collection Time: 06/01/20  8:23 AM  Result Value Ref Range   TSH 2.846 0.350 - 4.500 uIU/mL    Comment: Performed by a 3rd Generation assay with a functional sensitivity of <=0.01 uIU/mL. Performed at [facility], [address] [place], [place] [address]     Current Facility-Administered Medications  Medication Dose Route Frequency Provider Last Rate Last Admin  . acetaminophen (TYLENOL) suppository 650 mg  650 mg Rectal Q6H PRN Karl ItoSommer, Steven E, MD   650 mg at 05/30/20 2045  . albuterol (PROVENTIL) (2.5 MG/3ML) 0.083% nebulizer solution 2.5 mg  2.5 mg Nebulization Q6H PRN Smith, Rondell A, MD      . Ampicillin-Sulbactam (UNASYN) 3 g in sodium chloride 0.9 % 100 mL IVPB  3 g Intravenous Q6H Chand, Sudham, MD 200 mL/hr at 06/01/20 1158 3 g at 06/01/20 1158  . chlorhexidine (PERIDEX) 0.12 % solution 15 mL  15 mL Mouth Rinse BID Cheri Fowlerhand, Sudham, MD      . Chlorhexidine Gluconate Cloth 2 % PADS 6 each  6 each Topical Q0600 Lynnell CatalanAgarwala, Ravi, MD   6 each at 05/31/20 1400  . dexmedetomidine (PRECEDEX) 400 MCG/100ML (4 mcg/mL) infusion  0.2-0.7 mcg/kg/hr Intravenous Continuous Gleason, Darcella GasmanLaura R, PA-C   Stopped at 06/01/20 1100  . dextrose  5 % in lactated ringers infusion   Intravenous Continuous Leroy Sea, MD      . docusate sodium (COLACE) capsule 100 mg  100 mg Oral BID Cheri Fowler, MD      . enoxaparin (LOVENOX) injection 55 mg  55 mg Subcutaneous Q24H Vicente Serene, RPH   55 mg at 05/31/20 1737  . folic acid (FOLVITE) tablet 1 mg  1 mg  Oral Daily Cheri Fowler, MD   1 mg at 06/01/20 1100  . LORazepam (ATIVAN) injection 1 mg  1 mg Intravenous Q6H PRN Leroy Sea, MD      . MEDLINE mouth rinse  15 mL Mouth Rinse q12n4p Cheri Fowler, MD   15 mL at 05/31/20 1558  . multivitamin with minerals tablet 1 tablet  1 tablet Oral Daily Cheri Fowler, MD   1 tablet at 06/01/20 1100  . pantoprazole (PROTONIX) injection 40 mg  40 mg Intravenous Q24H Leroy Sea, MD   40 mg at 06/01/20 1100  . sodium chloride flush (NS) 0.9 % injection 3 mL  3 mL Intravenous Q12H Smith, Rondell A, MD   3 mL at 05/31/20 2138  . thiamine (B-1) injection 100 mg  100 mg Intravenous Daily Leroy Sea, MD   100 mg at 06/01/20 1100    Musculoskeletal: Strength & Muscle Tone: within normal limits Gait & Station: unable to stand Patient leans: N/A            Psychiatric Specialty Exam:  Presentation  General Appearance: Disheveled; Bizarre  Eye Contact:None  Speech:Slow; Slurred  Speech Volume:Decreased  Handedness:Right   Mood and Affect  Mood:Irritable  Affect:Constricted; Blunt   Thought Process  Thought Processes:Linear  Descriptions of Associations:Intact  Orientation:Full (Time, Place and Person)  Thought Content:Logical  History of Schizophrenia/Schizoaffective disorder:No data recorded Duration of Psychotic Symptoms:No data recorded Hallucinations:Hallucinations: Visual; Tactile (denies at this time, alst seen this morning around 6/7 am) Description of Visual Hallucinations: rabbit and insects  Ideas of Reference:None  Suicidal Thoughts:Suicidal Thoughts: Yes, Passive SI Active Intent and/or Plan: With Intent; With Means to Carry Out; With Access to Means  Homicidal Thoughts:Homicidal Thoughts: No   Sensorium  Memory:Immediate Fair; Recent Fair; Remote Fair  Judgment:Poor  Insight:Lacking   Executive Functions  Concentration:Poor  Attention Span:Fair  Recall:Fair  Fund of  Knowledge:Fair  Language:Fair   Psychomotor Activity  Psychomotor Activity:Psychomotor Activity: Psychomotor Retardation   Assets  Assets:Desire for Improvement; Leisure Time; Housing; Physical Health; Social Support   Sleep  Sleep:Sleep: Poor   Physical Exam: Physical Exam Vitals and nursing note reviewed.  Constitutional:      General: She is sleeping.     Appearance: She is obese.  HENT:     Mouth/Throat:     Mouth: Mucous membranes are moist.  Skin:    Findings: Erythema present.          Comments: multiple red streaks on anterior forearms, erythema noted to hands.   Neurological:     General: No focal deficit present.     Mental Status: She is oriented to person, place, and time and easily aroused. She is lethargic.  Psychiatric:        Attention and Perception: Attention and perception normal.        Mood and Affect: Mood is anxious and depressed.        Speech: Speech is delayed. Speech is not slurred.        Behavior: Behavior is agitated. Behavior is not slowed.  Thought Content: Thought content includes suicidal ideation.        Cognition and Memory: Cognition normal.        Judgment: Judgment is impulsive.    Review of Systems  Psychiatric/Behavioral: Positive for depression and suicidal ideas. The patient is nervous/anxious and has insomnia.    Blood pressure (!) 119/92, pulse 64, temperature 98 F (36.7 C), temperature source Oral, resp. rate (!) 23, height 6\' 1"  (1.854 m), weight 109.4 kg, last menstrual period 05/29/2020, SpO2 97 %. Body mass index is 31.82 kg/m.  Treatment Plan Summary: Plan Recommend psychiatric inpatient admission once medically stable. Would not recommend resuming home medications at this time, until she is medically stable.  Patient remains at high risk for anticholinergic toxicity and serotonin syndrome due to acute ingestion of multiple psychotropic medications.    -Continue to monitor closely for serotonin syndrome  (agitation, restlessness, confusion, hyperthermia, muscle rigidity)  - Will schedule Ativan 0.5mg  po BID to help with agitation.  -Serial EKGs ordered for monitoring of QTC prolongation. Original EKG, QTc 605.  -Will psychotropic medications once medically cleared, and QTc within normal range.  - Continue IVC and 05/31/2020, patient continues to endorse suicidal thoughts with no insight into her most recent attempt and severity.    -Recommend providing resources for IOP/PHP, as patient will continue to experience loss by separation from her wife.  She continues to display difficulty with emotional regulation, attachment issues will benefit from intensive therapy after hospitalization.  Disposition: Recommend psychiatric Inpatient admission when medically cleared.  Recruitment consultant, FNP 06/01/2020 1:36 PM

## 2020-06-01 NOTE — Progress Notes (Signed)
PCCM interval progress note:   Paged to see patient for worsening agitation that began early in the evening, pt has been hallucinating, seeing animals coming out the walls and got out of bed with fall from standing to sitting.  Vitals stable with mild tremors, but no tachycardia or hypertension.  On exam, pt anxious, no midline back pain and moving all extremities, no sign of traumatic injury.  She is currently oriented to place and person  P: -concern that she is developing Benzo withdrawal and possibly ETOH withdrawal concurrently -Valium 2.5mg  x1, if remains agitated start CIWA   [place] Rieger R Mariem Skolnick, PA-C

## 2020-06-02 DIAGNOSIS — F332 Major depressive disorder, recurrent severe without psychotic features: Secondary | ICD-10-CM | POA: Diagnosis not present

## 2020-06-02 LAB — CBC WITH DIFFERENTIAL/PLATELET
Abs Immature Granulocytes: 0.02 10*3/uL (ref 0.00–0.07)
Basophils Absolute: 0 10*3/uL (ref 0.0–0.1)
Basophils Relative: 0 %
Eosinophils Absolute: 0.2 10*3/uL (ref 0.0–0.5)
Eosinophils Relative: 2 %
HCT: 35.6 % — ABNORMAL LOW (ref 36.0–46.0)
Hemoglobin: 11.6 g/dL — ABNORMAL LOW (ref 12.0–15.0)
Immature Granulocytes: 0 %
Lymphocytes Relative: 16 %
Lymphs Abs: 1.1 10*3/uL (ref 0.7–4.0)
MCH: 26.9 pg (ref 26.0–34.0)
MCHC: 32.6 g/dL (ref 30.0–36.0)
MCV: 82.4 fL (ref 80.0–100.0)
Monocytes Absolute: 0.5 10*3/uL (ref 0.1–1.0)
Monocytes Relative: 7 %
Neutro Abs: 5.2 10*3/uL (ref 1.7–7.7)
Neutrophils Relative %: 75 %
Platelets: 143 10*3/uL — ABNORMAL LOW (ref 150–400)
RBC: 4.32 MIL/uL (ref 3.87–5.11)
RDW: 12.8 % (ref 11.5–15.5)
WBC: 7 10*3/uL (ref 4.0–10.5)
nRBC: 0 % (ref 0.0–0.2)

## 2020-06-02 LAB — PROCALCITONIN: Procalcitonin: 0.11 ng/mL

## 2020-06-02 LAB — MAGNESIUM: Magnesium: 1.6 mg/dL — ABNORMAL LOW (ref 1.7–2.4)

## 2020-06-02 LAB — COMPREHENSIVE METABOLIC PANEL
ALT: 65 U/L — ABNORMAL HIGH (ref 0–44)
AST: 65 U/L — ABNORMAL HIGH (ref 15–41)
Albumin: 2.9 g/dL — ABNORMAL LOW (ref 3.5–5.0)
Alkaline Phosphatase: 86 U/L (ref 38–126)
Anion gap: 5 (ref 5–15)
BUN: 7 mg/dL (ref 6–20)
CO2: 22 mmol/L (ref 22–32)
Calcium: 8.4 mg/dL — ABNORMAL LOW (ref 8.9–10.3)
Chloride: 112 mmol/L — ABNORMAL HIGH (ref 98–111)
Creatinine, Ser: 0.81 mg/dL (ref 0.44–1.00)
GFR, Estimated: >60 mL/min (ref >60)
Glucose, Bld: 106 mg/dL — ABNORMAL HIGH (ref 70–99)
Potassium: 3.3 mmol/L — ABNORMAL LOW (ref 3.5–5.1)
Sodium: 139 mmol/L (ref 135–145)
Total Bilirubin: 1.3 mg/dL — ABNORMAL HIGH (ref 0.3–1.2)
Total Protein: 5.6 g/dL — ABNORMAL LOW (ref 6.5–8.1)

## 2020-06-02 LAB — GLUCOSE, CAPILLARY
Glucose-Capillary: 104 mg/dL — ABNORMAL HIGH (ref 70–99)
Glucose-Capillary: 95 mg/dL (ref 70–99)

## 2020-06-02 MED ORDER — POTASSIUM CHLORIDE CRYS ER 20 MEQ PO TBCR
40.0000 meq | EXTENDED_RELEASE_TABLET | ORAL | Status: AC
  Administered 2020-06-02 (×2): 40 meq via ORAL
  Filled 2020-06-02 (×2): qty 2

## 2020-06-02 MED ORDER — THIAMINE HCL 100 MG PO TABS
100.0000 mg | ORAL_TABLET | Freq: Every day | ORAL | Status: DC
  Administered 2020-06-02 – 2020-06-03 (×2): 100 mg via ORAL
  Filled 2020-06-02 (×2): qty 1

## 2020-06-02 MED ORDER — MAGNESIUM SULFATE 4 GM/100ML IV SOLN
4.0000 g | Freq: Once | INTRAVENOUS | Status: AC
  Administered 2020-06-02: 4 g via INTRAVENOUS
  Filled 2020-06-02: qty 100

## 2020-06-02 MED ORDER — PANTOPRAZOLE SODIUM 40 MG PO TBEC
40.0000 mg | DELAYED_RELEASE_TABLET | Freq: Every day | ORAL | Status: DC
  Administered 2020-06-02 – 2020-06-03 (×2): 40 mg via ORAL
  Filled 2020-06-02: qty 1

## 2020-06-02 MED ORDER — AMOXICILLIN-POT CLAVULANATE 875-125 MG PO TABS
1.0000 | ORAL_TABLET | Freq: Two times a day (BID) | ORAL | Status: DC
  Administered 2020-06-02 – 2020-06-03 (×2): 1 via ORAL
  Filled 2020-06-02 (×2): qty 1

## 2020-06-02 NOTE — Evaluation (Signed)
Occupational Therapy Evaluation Patient Details Name: Ashley Valencia MRN: [id number] DOB: 1991/02/25 Today's Date: 06/02/2020    History of Present Illness 30 year old female with history of major depression who was admitted with altered mental status due to drug overdose in an attempt of suicide, a work-up was consistent with suicidal ideation, aspiration pneumonia, respiratory failure and metabolic encephalopathy.Psych has recommended inpatient psych admission after she is medically cleared.  Was in Inpt psych October 2021.   Clinical Impression   Pt PTA: Pt living with s/o in home and independent. Pt reports working. Pt currently alert and oriented with no focal deficits, and nearly flat affect. Physically, pt can perform own ADL and mobility with independence. Pt reports proper hygiene routine at home and showering daily at home and in hospital. Pt did not report any s/s of depression throughout interview other than "holidays aren't the same without my Mom since she passed." HR remained 90-105 BPM with exertion; BP 154/84 at rest, BP 154/83 to conclude session. No further OT skilled services required. OT signing off. Thank you.      Follow Up Recommendations  No OT follow up    Equipment Recommendations  None recommended by OT    Recommendations for Other Services       Precautions / Restrictions Precautions Precautions: None Precaution Comments: sitter for 1:1 Restrictions Weight Bearing Restrictions: No      Mobility Bed Mobility Overal bed mobility: Needs Assistance Bed Mobility: Supine to Sit;Sit to Supine     Supine to sit: Modified independent (Device/Increase time) Sit to supine: Modified independent (Device/Increase time)   General bed mobility comments: no physical assist    Transfers Overall transfer level: Modified independent Equipment used: None Transfers: Sit to/from Stand Sit to Stand: Modified independent (Device/Increase time)         General  transfer comment: No physical assist; pt not drowsy anymore with no physical limitations    Balance Overall balance assessment: Modified Independent             Standing balance comment: no LOB episodes                           ADL either performed or assessed with clinical judgement   ADL Overall ADL's : Independent                                       General ADL Comments: No physical assist required. Pt performing all ADL and mobility in room without assist. Pt standing for grooming hair and independently getting in/out of bed     Vision Baseline Vision/History: Wears glasses Wears Glasses: Reading only Patient Visual Report: No change from baseline Vision Assessment?: No apparent visual deficits     Perception     Praxis      Pertinent Vitals/Pain Pain Assessment: No/denies pain     Hand Dominance Right   Extremity/Trunk Assessment Upper Extremity Assessment Upper Extremity Assessment: Overall WFL for tasks assessed;RUE deficits/detail;LUE deficits/detail RUE Deficits / Details: 5/5 strength LUE Deficits / Details: 5/5 strength   Lower Extremity Assessment Lower Extremity Assessment: Overall WFL for tasks assessed   Cervical / Trunk Assessment Cervical / Trunk Assessment: Normal   Communication Communication Communication: No difficulties   Cognition Arousal/Alertness: Awake/alert Behavior During Therapy: WFL for tasks assessed/performed Overall Cognitive Status: Within Functional Limits for tasks assessed  General Comments: following all commands, no focal deficits. No drowsiness noted.   General Comments  VSS    Exercises     Shoulder Instructions      Home Living Family/patient expects to be discharged to:: Private residence Living Arrangements: Other relatives;Children;Spouse/significant other Available Help at Discharge: Family;Available PRN/intermittently Type of  Home: Mobile home Home Access: Stairs to enter Entrance Stairs-Number of Steps: 3 Entrance Stairs-Rails: Left Home Layout: One level     Bathroom Shower/Tub: Chief Strategy Officer: Standard     Home Equipment: None          Prior Functioning/Environment Level of Independence: Independent        Comments: worked at [employer] full time        OT Problem List:        OT Treatment/Interventions:      OT Goals(Current goals can be found in the care plan section) Acute Rehab OT Goals Patient Stated Goal: to get back to work OT Goal Formulation: All assessment and education complete, DC therapy  OT Frequency:     Barriers to D/C:            Co-evaluation              AM-PAC OT "6 Clicks" Daily Activity     Outcome Measure Help from another person eating meals?: None Help from another person taking care of personal grooming?: None Help from another person toileting, which includes using toliet, bedpan, or urinal?: None Help from another person bathing (including washing, rinsing, drying)?: None Help from another person to put on and taking off regular upper body clothing?: None Help from another person to put on and taking off regular lower body clothing?: None 6 Click Score: 24   End of Session Nurse Communication: Mobility status  Activity Tolerance: Patient tolerated treatment well Patient left: in bed;with call bell/phone within reach;with nursing/sitter in room  OT Visit Diagnosis: Unsteadiness on feet (R26.81)                Time: 6761-9509 OT Time Calculation (min): 15 min Charges:  OT General Charges $OT Visit: 1 Visit OT Evaluation $OT Eval Moderate Complexity: 1 Mod  Flora Lipps, OTR/L Acute Rehabilitation Services Pager: [phone] Office: [phone]   Lometa Riggin C 06/02/2020, 9:00 AM

## 2020-06-02 NOTE — Progress Notes (Addendum)
Per Caryn Bee, FNP patient meets inpatient psychiatric criteria - CSW faxed information to the following facilities for review.  [facility]  Edwin Dada, MSW, LCSW Transitions of Care  Clinical Social Worker II [phone]

## 2020-06-02 NOTE — Consult Note (Signed)
°  Patient continues to meet inpatient criteria at this time. She has been medically cleared. Contact has been made with [facility] at [facility] and Inpatient SW Edwin Dada. If no appropriate beds are available may require patient to be faxed out of system. She is currently under IVC at this time.  Will continue to monitor at this time, hopefully patient will be accepted today. Psychiatry to follow.

## 2020-06-02 NOTE — Progress Notes (Signed)
  Speech Language Pathology Treatment: Dysphagia  Patient Details Name: Ashley Valencia MRN: [id number] DOB: 07/10/1990 Today's Date: 06/02/2020 Time: 7371-0626 SLP Time Calculation (min) (ACUTE ONLY): 8.95 min  Assessment / Plan / Recommendation Clinical Impression  Pt was seen for dysphagia treatment and was cooperative throughout the session. Pt's diet was upgraded to regular texture since the initial evaluation. Pt, her wife, nurse tech in room, and nursing, and that the pt has been tolerating the current diet without overt s/sx of aspiration. Pt tolerated puree solids, regular texture solids, and >4oz thin liquids via straw using consecutive swallows without symptoms of oropharyngeal dysphagia. It is recommended that the current diet be continued. Further skilled SLP services are not clinically indicated at this time.    HPI HPI: 30 year old female with reported prior history of depression, migraines, and bladder problems presented from home on 3/19 evening by EMS for intentional overdose by pills and ETOH. 3/20 developed respiratory distress in ED, transferred to ICU- now with asp pna.      SLP Plan  Discharge SLP treatment due to (comment);All goals met       Recommendations  Diet recommendations: Regular;Thin liquid Liquids provided via: Cup;Straw Medication Administration: Whole meds with liquid Supervision: Patient able to self feed                Oral Care Recommendations: Oral care BID Follow up Recommendations: None SLP Visit Diagnosis: Dysphagia, unspecified (R13.10) Plan: Discharge SLP treatment due to (comment);All goals met       Shanika I. Hardin Negus, [place], [place] Office number [phone] Pager [place] 06/02/2020, 9:48 AM

## 2020-06-02 NOTE — Progress Notes (Signed)
PROGRESS NOTE    Ashley Valencia  JXB:[id number] DOB: 07-08-1990 DOA: 05/29/2020 PCP: Soundra Pilon, FNP    Brief Narrative:  Ashley Valencia is a 30 year old female with history of major depression who was admitted with altered mental status due to drug overdose in an attempt of suicide, a work-up was consistent with suicidal ideation, aspiration pneumonia, respiratory failure, she was seen by PCCM and psych, psych has recommended inpatient psych admission after she is medically cleared, she was transferred to the hospitalist service on 06/01/2020.   Previous team discussed her case with patient's legal wife Albin Felling [phone], patient was involuntarily admitted to a psych hospital in October 2021 for suicidal ideations.   Assessment & Plan:   Principal Problem:   Severe recurrent major depression without psychotic features (HCC) Active Problems:   Intentional drug overdose, initial encounter (HCC)   Overdose   Attempted suicide with intentional polypharmacy overdose in a patient with history of same in the recent past, depression  Patient presenting to the with altered mental status secondary to intentional overdose on multiple medications.  Psychiatry was consulted and followed during hospital course.  Patient's current home and antidepressants are currently on hold due to prolonged QTC.  Patient's mental status now back at her normal baseline and is currently medically stable for transfer to inpatient psych hospital.  Severe metabolic encephalopathy and agitation: Resolved Due to severe agitation, patient was initiated on a Precedex drip by PCCM during initial hospitalization which was eventually titrated off.  Continues with Ativan as needed.  Recommend avoid Haldol due to QTC prolongation.  Acute hypoxic respiratory failure due to aspiration pneumonia.   Patient was noted to be hypoxic with SPO2 84% requiring up to 5 L nasal cannula.  Etiology likely multifactorial with overdose  as above causing metabolic encephalopathy as well as noted aspiration pneumonia on imaging.  Patient was started on empiric antibiotics with Unasyn and transitioned to Augmentin.  Supplemental oxygen now titrated off.  Recommend complete 7-day course of antibiotics, 5 more days of Augmentin.  Prolonged QTc Continue to hold offending psych medications.   Hypokalemia/hypomagnesemia: Replacing  Patient is currently medically stable for discharge to inpatient psychiatry.  Recommend continue Augmentin to complete 7-day total antibiotic course.   DVT prophylaxis: Lovenox   Code Status: Full Code Family Communication: No family present at bedside this morning  Disposition Plan:  Level of care: Telemetry Medical Status is: Inpatient  Remains inpatient appropriate because:Unsafe d/c plan and Inpatient level of care appropriate due to severity of illness   Dispo: The patient is from: Home              Anticipated d/c is to: Inpatient psych hospital              Patient currently is medically stable to d/c.   Difficult to place patient No   Consultants:   PCCM  Psychiatry  Procedures:   None  Antimicrobials:   Unasyn 3/20 - 3/23  Augmentin 3/23>>  Anti-infectives (From admission, onward)   Start     Dose/Rate Route Frequency Ordered Stop   06/02/20 1700  amoxicillin-clavulanate (AUGMENTIN) 875-125 MG per tablet 1 tablet        1 tablet Oral Every 12 hours 06/02/20 1024 06/07/20 2159   05/31/20 1215  Ampicillin-Sulbactam (UNASYN) 3 g in sodium chloride 0.9 % 100 mL IVPB  Status:  Discontinued        3 g 200 mL/hr over 30 Minutes Intravenous Every 6 hours 05/31/20  1118 06/02/20 1024   05/30/20 1700  Ampicillin-Sulbactam (UNASYN) 3 g in sodium chloride 0.9 % 100 mL IVPB  Status:  Discontinued        3 g 200 mL/hr over 30 Minutes Intravenous Every 6 hours 05/30/20 1540 05/31/20 0845   05/30/20 1500  Ampicillin-Sulbactam (UNASYN) 3 g in sodium chloride 0.9 % 100 mL IVPB  Status:   Discontinued        3 g 200 mL/hr over 30 Minutes Intravenous Every 6 hours 05/30/20 1451 05/30/20 1540       Subjective: Patient seen and examined at bedside, resting comfortably in bed.  Oxygen now titrated off.  Mental status appears at baseline.  Sitter present.  Wants to know when she can go home.  Discussed that she is now medically stable, but given her recurrent suicidal attempt that psychiatry recommending transfer to inpatient psychiatry bed once bed available.  Patient without any questions or concerns at this time.  Denies headache, no chest pain, no palpitations, no shortness of breath, no abdominal pain, no weakness, no fatigue.  No acute events overnight per nursing staff.  Objective: Vitals:   06/01/20 2332 06/02/20 0000 06/02/20 0326 06/02/20 0734  BP:      Pulse:      Resp:  (!) 27    Temp: 98.5 F (36.9 C)  98.5 F (36.9 C) 97.9 F (36.6 C)  TempSrc: Oral  Oral Oral  SpO2:  98%    Weight:      Height:        Intake/Output Summary (Last 24 hours) at 06/02/2020 1041 Last data filed at 06/01/2020 1800 Gross per 24 hour  Intake 840.36 ml  Output 1175 ml  Net -334.64 ml   Filed Weights   05/29/20 2246 05/31/20 0430  Weight: 104.3 kg 109.4 kg    Examination:  General exam: Appears calm and comfortable  Respiratory system: Clear to auscultation. Respiratory effort normal.  On room air Cardiovascular system: S1 & S2 heard, RRR. No JVD, murmurs, rubs, gallops or clicks. No pedal edema. Gastrointestinal system: Abdomen is nondistended, soft and nontender. No organomegaly or masses felt. Normal bowel sounds heard. Central nervous system: Alert and oriented. No focal neurological deficits. Extremities: Symmetric 5 x 5 power. Skin: No rashes, lesions or ulcers Psychiatry: Judgement and insight appear poor. Mood & affect appropriate.     Data Reviewed: I have personally reviewed following labs and imaging studies  CBC: Recent Labs  Lab 05/29/20 2248  05/30/20 1556 05/31/20 0359 06/01/20 0823 06/02/20 0038  WBC 7.4  --  13.2* 8.0 7.0  NEUTROABS  --   --   --   --  5.2  HGB 14.1 14.3 12.5 11.3* 11.6*  HCT 42.8 42.0 39.6 35.1* 35.6*  MCV 82.1  --  84.6 83.6 82.4  PLT 205  --  139* 125* 143*   Basic Metabolic Panel: Recent Labs  Lab 05/29/20 0013 05/29/20 2248 05/29/20 2248 05/30/20 0805 05/30/20 1556 05/31/20 0308 06/01/20 0823 06/02/20 0038  NA  --  137   < > 140 143 141 139 139  K  --  3.4*   < > 3.9 3.8 3.9 3.5 3.3*  CL  --  108  --  108  --  114* 112* 112*  CO2  --  19*  --  23  --  22 23 22   GLUCOSE  --  127*  --  130*  --  117* 114* 106*  BUN  --  10  --  9  --  7 6 7   CREATININE  --  0.72  --  0.82  --  0.90 0.74 0.81  CALCIUM  --  9.2  --  8.8*  --  8.3* 8.5* 8.4*  MG 2.0  --   --   --   --   --  1.9 1.6*   < > = values in this interval not displayed.   GFR: Estimated Creatinine Clearance: 145.3 mL/min (by C-G formula based on SCr of 0.81 mg/dL). Liver Function Tests: Recent Labs  Lab 05/29/20 2248 05/31/20 0308 06/02/20 0038  AST 31 19 65*  ALT 34 22 65*  ALKPHOS 43 34* 86  BILITOT 1.0 1.3* 1.3*  PROT 7.0 5.4* 5.6*  ALBUMIN 4.2 3.0* 2.9*   No results for input(s): LIPASE, AMYLASE in the last 168 hours. No results for input(s): AMMONIA in the last 168 hours. Coagulation Profile: No results for input(s): INR, PROTIME in the last 168 hours. Cardiac Enzymes: No results for input(s): CKTOTAL, CKMB, CKMBINDEX, TROPONINI in the last 168 hours. BNP (last 3 results) No results for input(s): PROBNP in the last 8760 hours. HbA1C: Recent Labs    06/01/20 0823  HGBA1C 5.3   CBG: Recent Labs  Lab 06/01/20 1600 06/01/20 1929 06/01/20 2317 06/02/20 0315 06/02/20 0733  GLUCAP 70 71 97 104* 95   Lipid Profile: No results for input(s): CHOL, HDL, LDLCALC, TRIG, CHOLHDL, LDLDIRECT in the last 72 hours. Thyroid Function Tests: Recent Labs    06/01/20 0823  TSH 2.846   Anemia Panel: No results for  input(s): VITAMINB12, FOLATE, FERRITIN, TIBC, IRON, RETICCTPCT in the last 72 hours. Sepsis Labs: Recent Labs  Lab 05/30/20 1824 05/30/20 2130 05/31/20 0308 06/01/20 0142 06/02/20 0038  PROCALCITON <0.10  --  0.20 0.13 0.11  LATICACIDVEN 5.1* 2.8*  --   --   --     Recent Results (from the past 240 hour(s))  Resp Panel by RT-PCR (Flu A&B, Covid) Nasopharyngeal Swab     Status: None   Collection Time: 05/30/20 12:54 AM   Specimen: Nasopharyngeal Swab; Nasopharyngeal(NP) swabs in vial transport medium  Result Value Ref Range Status   SARS Coronavirus 2 by RT PCR NEGATIVE NEGATIVE Final    Comment: (NOTE) SARS-CoV-2 target nucleic acids are NOT DETECTED.  The SARS-CoV-2 RNA is generally detectable in upper respiratory specimens during the acute phase of infection. The lowest concentration of SARS-CoV-2 viral copies this assay can detect is 138 copies/mL. A negative result does not preclude SARS-Cov-2 infection and should not be used as the sole basis for treatment or other patient management decisions. A negative result may occur with  improper specimen collection/handling, submission of specimen other than nasopharyngeal swab, presence of viral mutation(s) within the areas targeted by this assay, and inadequate number of viral copies(<138 copies/mL). A negative result must be combined with clinical observations, patient history, and epidemiological information. The expected result is Negative.  Fact Sheet for Patients:  06/01/20  Fact Sheet for Healthcare Providers:  BloggerCourse.com  This test is no t yet approved or cleared by the [place] FDA and  has been authorized for detection and/or diagnosis of SARS-CoV-2 by FDA under an Emergency Use Authorization (EUA). This EUA will remain  in effect (meaning this test can be used) for the duration of the COVID-19 declaration under Section 564(b)(1) of the Act,  21 U.S.C.section 360bbb-3(b)(1), unless the authorization is terminated  or revoked sooner.       Influenza A by PCR  NEGATIVE NEGATIVE Final   Influenza B by PCR NEGATIVE NEGATIVE Final    Comment: (NOTE) The Xpert Xpress SARS-CoV-2/FLU/RSV plus assay is intended as an aid in the diagnosis of influenza from Nasopharyngeal swab specimens and should not be used as a sole basis for treatment. Nasal washings and aspirates are unacceptable for Xpert Xpress SARS-CoV-2/FLU/RSV testing.  Fact Sheet for Patients: BloggerCourse.comhttps://www.fda.gov/media/152166/download  Fact Sheet for Healthcare Providers: SeriousBroker.ithttps://www.fda.gov/media/152162/download  This test is not yet approved or cleared by the [place] FDA and has been authorized for detection and/or diagnosis of SARS-CoV-2 by FDA under an Emergency Use Authorization (EUA). This EUA will remain in effect (meaning this test can be used) for the duration of the COVID-19 declaration under Section 564(b)(1) of the Act, 21 U.S.C. section 360bbb-3(b)(1), unless the authorization is terminated or revoked.  Performed at [facility], [address] [place], [place] [address]   MRSA PCR Screening     Status: None   Collection Time: 05/30/20  3:54 PM   Specimen: Nasopharyngeal  Result Value Ref Range Status   MRSA by PCR NEGATIVE NEGATIVE Final    Comment:        The GeneXpert MRSA Assay (FDA approved for NASAL specimens only), is one component of a comprehensive MRSA colonization surveillance program. It is not intended to diagnose MRSA infection nor to guide or monitor treatment for MRSA infections. Performed at [facility], [address] [place], [place] [address]   Culture, blood (routine x 2)     Status: None (Preliminary result)   Collection Time: 05/31/20  9:10 AM   Specimen: BLOOD LEFT HAND  Result Value Ref Range Status   Specimen Description BLOOD LEFT HAND  Final   Special Requests   Final    BOTTLES DRAWN  AEROBIC AND ANAEROBIC Blood Culture adequate volume   Culture   Final    NO GROWTH 2 DAYS Performed at [facility], [address] [place], [place] [address]    Report Status PENDING  Incomplete  Culture, blood (routine x 2)     Status: None (Preliminary result)   Collection Time: 05/31/20  9:10 AM   Specimen: BLOOD RIGHT HAND  Result Value Ref Range Status   Specimen Description BLOOD RIGHT HAND  Final   Special Requests   Final    BOTTLES DRAWN AEROBIC AND ANAEROBIC Blood Culture adequate volume   Culture   Final    NO GROWTH 2 DAYS Performed at [facility], [address] [place], [place] [address]    Report Status PENDING  Incomplete         Radiology Studies: No results found.      Scheduled Meds: . amoxicillin-clavulanate  1 tablet Oral Q12H  . chlorhexidine  15 mL Mouth Rinse BID  . Chlorhexidine Gluconate Cloth  6 each Topical Q0600  . docusate sodium  100 mg Oral BID  . enoxaparin (LOVENOX) injection  55 mg Subcutaneous Q24H  . folic acid  1 mg Oral Daily  . LORazepam  0.5 mg Oral BID  . mouth rinse  15 mL Mouth Rinse q12n4p  . multivitamin with minerals  1 tablet Oral Daily  . pantoprazole  40 mg Oral Daily  . potassium chloride  40 mEq Oral Q4H  . sodium chloride flush  3 mL Intravenous Q12H  . thiamine  100 mg Oral Daily   Continuous Infusions: . magnesium sulfate bolus IVPB 4 g (06/02/20 1034)  LOS: 3 days    Time spent: 39 minutes spent on chart review, discussion with nursing staff, consultants, updating family and interview/physical exam; more than 50% of that time was spent in counseling and/or coordination of care.    Alvira Philips [place], DO [facility] Available via Epic secure chat 7am-7pm After these hours, please refer to coverage provider listed on [email or web address] 06/02/2020, 10:41 AM

## 2020-06-02 NOTE — Progress Notes (Signed)
Physical Therapy Treatment and Discharge Patient Details Name: Ashley Valencia MRN: [id number] DOB: 1991/01/08 Today's Date: 06/02/2020    History of Present Illness 30 year old female with history of major depression who was admitted with altered mental status due to drug overdose in an attempt of suicide, a work-up was consistent with suicidal ideation, aspiration pneumonia, respiratory failure and metabolic encephalopathy.Psych has recommended inpatient psych admission after she is medically cleared.  Was in [place] psych October 2021.    PT Comments    Patient doing much better today. Able to ambulate independently with head turns and no imbalance. Able to Rhomberg with eyes closed for 10+ seconds. No further PT needs identified. PT is signing off.    Follow Up Recommendations  No PT follow up     Equipment Recommendations  None recommended by PT    Recommendations for Other Services       Precautions / Restrictions Precautions Precautions: None Precaution Comments: sitter for 1:1    Mobility  Bed Mobility Overal bed mobility: Needs Assistance Bed Mobility: Supine to Sit;Sit to Supine     Supine to sit: Modified independent (Device/Increase time) Sit to supine: Modified independent (Device/Increase time)   General bed mobility comments: no physical assist    Transfers Overall transfer level: Modified independent Equipment used: None Transfers: Sit to/from Stand Sit to Stand: Modified independent (Device/Increase time)         General transfer comment: No physical assist; pt not drowsy anymore with no physical limitations  Ambulation/Gait Ambulation/Gait assistance: Independent Gait Distance (Feet): 150 Feet Assistive device: None Gait Pattern/deviations: WFL(Within Functional Limits)         Stairs             Wheelchair Mobility    Modified Rankin (Stroke Patients Only)       Balance Overall balance assessment: Modified Independent                Single Leg Stance - Right Leg: 5 Single Leg Stance - Left Leg: 3       Rhomberg - Eyes Closed: 10   High Level Balance Comments: Patient reports she has never been able to stand on 1 foot well.            Cognition Arousal/Alertness: Awake/alert Behavior During Therapy: WFL for tasks assessed/performed Overall Cognitive Status: Within Functional Limits for tasks assessed                                 General Comments: following all commands, no focal deficits. No drowsiness noted.      Exercises      General Comments General comments (skin integrity, edema, etc.): HR 104-109      Pertinent Vitals/Pain      Home Living                      Prior Function            PT Goals (current goals can now be found in the care plan section) Acute Rehab PT Goals Patient Stated Goal: to get back to work PT Goal Formulation: With patient Time For Goal Achievement: 06/15/20 Potential to Achieve Goals: Good Progress towards PT goals: Goals met/education completed, patient discharged from PT    Frequency    Min 3X/week      PT Plan Discharge plan needs to be updated    Co-evaluation  AM-PAC PT "6 Clicks" Mobility   Outcome Measure  Help needed turning from your back to your side while in a flat bed without using bedrails?: None Help needed moving from lying on your back to sitting on the side of a flat bed without using bedrails?: None Help needed moving to and from a bed to a chair (including a wheelchair)?: None Help needed standing up from a chair using your arms (e.g., wheelchair or bedside chair)?: None Help needed to walk in hospital room?: None Help needed climbing 3-5 steps with a railing? : None 6 Click Score: 24    End of Session   Activity Tolerance: Patient tolerated treatment well Patient left: in bed;with call bell/phone within reach;with nursing/sitter in room (in chair position) Nurse  Communication: Mobility status (discharge from PT) PT Visit Diagnosis: Unsteadiness on feet (R26.81);Muscle weakness (generalized) (M62.81)   PT Discharge Note  Patient is being discharged from PT services secondary to:  Marland Kitchen Goals met and no further therapy needs identified.  Please see latest Therapy Progress Note for current level of functioning and progress toward goals.  Progress and discharge plan and discussed with patient/caregiver and they  . Agree    Time: 7227-7375 PT Time Calculation (min) (ACUTE ONLY): 10 min  Charges:  $Gait Training: 8-22 mins                      Arby Barrette, PT Pager [phone]    Rexanne Mano 06/02/2020, 3:43 PM

## 2020-06-02 NOTE — Progress Notes (Signed)
Inpatient Rehabilitation Admissions Coordinator  Noted No OT follow up needed. Patient will not require CIR admit.  Ottie Glazier, RN, MSN Rehab Admissions Coordinator [phone] 06/02/2020 10:09 AM

## 2020-06-03 DIAGNOSIS — F1220 Cannabis dependence, uncomplicated: Secondary | ICD-10-CM | POA: Diagnosis not present

## 2020-06-03 DIAGNOSIS — K219 Gastro-esophageal reflux disease without esophagitis: Secondary | ICD-10-CM | POA: Diagnosis not present

## 2020-06-03 DIAGNOSIS — G43909 Migraine, unspecified, not intractable, without status migrainosus: Secondary | ICD-10-CM | POA: Diagnosis not present

## 2020-06-03 DIAGNOSIS — R45851 Suicidal ideations: Secondary | ICD-10-CM | POA: Diagnosis not present

## 2020-06-03 DIAGNOSIS — G43911 Migraine, unspecified, intractable, with status migrainosus: Secondary | ICD-10-CM | POA: Diagnosis not present

## 2020-06-03 DIAGNOSIS — F1020 Alcohol dependence, uncomplicated: Secondary | ICD-10-CM | POA: Diagnosis not present

## 2020-06-03 DIAGNOSIS — F332 Major depressive disorder, recurrent severe without psychotic features: Secondary | ICD-10-CM | POA: Diagnosis not present

## 2020-06-03 DIAGNOSIS — N3281 Overactive bladder: Secondary | ICD-10-CM | POA: Diagnosis not present

## 2020-06-03 DIAGNOSIS — Z634 Disappearance and death of family member: Secondary | ICD-10-CM | POA: Diagnosis not present

## 2020-06-03 DIAGNOSIS — F411 Generalized anxiety disorder: Secondary | ICD-10-CM | POA: Diagnosis not present

## 2020-06-03 DIAGNOSIS — T1491XA Suicide attempt, initial encounter: Secondary | ICD-10-CM | POA: Diagnosis not present

## 2020-06-03 LAB — CBC WITH DIFFERENTIAL/PLATELET
Abs Immature Granulocytes: 0.03 10*3/uL (ref 0.00–0.07)
Basophils Absolute: 0 10*3/uL (ref 0.0–0.1)
Basophils Relative: 1 %
Eosinophils Absolute: 0.3 10*3/uL (ref 0.0–0.5)
Eosinophils Relative: 4 %
HCT: 34.7 % — ABNORMAL LOW (ref 36.0–46.0)
Hemoglobin: 11.3 g/dL — ABNORMAL LOW (ref 12.0–15.0)
Immature Granulocytes: 1 %
Lymphocytes Relative: 22 %
Lymphs Abs: 1.4 10*3/uL (ref 0.7–4.0)
MCH: 26.6 pg (ref 26.0–34.0)
MCHC: 32.6 g/dL (ref 30.0–36.0)
MCV: 81.6 fL (ref 80.0–100.0)
Monocytes Absolute: 0.7 10*3/uL (ref 0.1–1.0)
Monocytes Relative: 10 %
Neutro Abs: 4.1 10*3/uL (ref 1.7–7.7)
Neutrophils Relative %: 62 %
Platelets: 158 10*3/uL (ref 150–400)
RBC: 4.25 MIL/uL (ref 3.87–5.11)
RDW: 12.7 % (ref 11.5–15.5)
WBC: 6.5 10*3/uL (ref 4.0–10.5)
nRBC: 0 % (ref 0.0–0.2)

## 2020-06-03 LAB — COMPREHENSIVE METABOLIC PANEL
ALT: 89 U/L — ABNORMAL HIGH (ref 0–44)
AST: 53 U/L — ABNORMAL HIGH (ref 15–41)
Albumin: 3 g/dL — ABNORMAL LOW (ref 3.5–5.0)
Alkaline Phosphatase: 104 U/L (ref 38–126)
Anion gap: 7 (ref 5–15)
BUN: 6 mg/dL (ref 6–20)
CO2: 22 mmol/L (ref 22–32)
Calcium: 8.6 mg/dL — ABNORMAL LOW (ref 8.9–10.3)
Chloride: 110 mmol/L (ref 98–111)
Creatinine, Ser: 0.74 mg/dL (ref 0.44–1.00)
GFR, Estimated: >60 mL/min (ref >60)
Glucose, Bld: 105 mg/dL — ABNORMAL HIGH (ref 70–99)
Potassium: 3.3 mmol/L — ABNORMAL LOW (ref 3.5–5.1)
Sodium: 139 mmol/L (ref 135–145)
Total Bilirubin: 0.7 mg/dL (ref 0.3–1.2)
Total Protein: 5.7 g/dL — ABNORMAL LOW (ref 6.5–8.1)

## 2020-06-03 LAB — GLUCOSE, CAPILLARY: Glucose-Capillary: 100 mg/dL — ABNORMAL HIGH (ref 70–99)

## 2020-06-03 LAB — MAGNESIUM: Magnesium: 2.1 mg/dL (ref 1.7–2.4)

## 2020-06-03 LAB — PROCALCITONIN: Procalcitonin: <0.1 ng/mL

## 2020-06-03 MED ORDER — AMOXICILLIN-POT CLAVULANATE 875-125 MG PO TABS: 1.0000 | ORAL_TABLET | Freq: Two times a day (BID) | ORAL | 0 refills | Status: AC

## 2020-06-03 MED ORDER — POTASSIUM CHLORIDE CRYS ER 20 MEQ PO TBCR
40.0000 meq | EXTENDED_RELEASE_TABLET | ORAL | Status: AC
  Administered 2020-06-03 (×2): 40 meq via ORAL
  Filled 2020-06-03 (×2): qty 2

## 2020-06-03 NOTE — Discharge Summary (Addendum)
Physician Discharge Summary  AMBIKA ZETTLEMOYER MCN:[id number] DOB: 1991-01-05 DOA: 05/29/2020  PCP: Soundra Pilon, FNP  Admit date: 05/29/2020 Discharge date: 06/03/2020  Admitted From: Home Disposition: [facility] psychiatric institution, accepting physician Dr. Beaulah Corin   Discharge Condition: Stable, currently on involuntary commitment and transferring to inpatient psych CODE STATUS: Full code Diet recommendation: Heart healthy diet  History of present illness:  Ashley Valencia is a 30 year old female with history of major depression who was admitted with altered mental status due to drug overdose in an attempt of suicide,a work-up was consistent with suicidal ideation, aspiration pneumonia, respiratory failure, she was seen by PCCM and psych, psych has recommended inpatient psych admission after she is medically cleared, she was transferred to the hospitalist service on 06/01/2020.   Previous team discussed her case with patient's legal wife Albin Felling [phone], patient was involuntarily admitted to a psych hospital in October 2021 for suicidal ideations.  Hospital course:  Attempted suicide with intentional polypharmacy overdose in a patient with history of same in the recent past, depression  Patient presenting to the with altered mental status secondary to intentional overdose on multiple medications.  Psychiatry was consulted and followed during hospital course. Patient's current home and antidepressants are currently on hold due to prolonged QTC.  Patient's mental status now back at her normal baseline and is currently medically stable for transfer to inpatient psych hospital.  Severe metabolic encephalopathy and agitation: Resolved Due to severe agitation, patient was initiated on a Precedex drip by PCCM during initial hospitalization which was eventually titrated off.  Continues with Ativan as needed. Recommend avoid Haldol due to QTC prolongation.  Acute hypoxic respiratory  failure due to aspiration pneumonia. Sepsis, ruled out Patient was noted to be hypoxic with SPO2 84% requiring up to 5 L nasal cannula.  Etiology likely multifactorial with overdose as above causing metabolic encephalopathy as well as noted aspiration pneumonia on imaging.  Patient was started on empiric antibiotics with Unasyn and transitioned to Augmentin.  Supplemental oxygen now titrated off.  Continue Augmentin to complete 7-day course.  Discharge Diagnoses:  Principal Problem:   Severe recurrent major depression without psychotic features [facility]) Active Problems:   Intentional drug overdose, initial encounter [facility])   Overdose    Discharge Instructions  Discharge Instructions    Diet - low sodium heart healthy   Complete by: As directed    Increase activity slowly   Complete by: As directed      Allergies as of 06/03/2020   No Known Allergies     Medication List    STOP taking these medications   ARIPiprazole 10 MG tablet Commonly known as: ABILIFY   darifenacin 15 MG 24 hr tablet Commonly known as: ENABLEX   Emgality 120 MG/ML Soaj Generic drug: Galcanezumab-gnlm   FLUoxetine 20 MG capsule Commonly known as: PROZAC   propranolol 40 MG tablet Commonly known as: INDERAL   solifenacin 10 MG tablet Commonly known as: VESICARE   traZODone 100 MG tablet Commonly known as: DESYREL   Xanax 0.5 MG tablet Generic drug: ALPRAZolam     TAKE these medications   amoxicillin-clavulanate 875-125 MG tablet Commonly known as: AUGMENTIN Take 1 tablet by mouth every 12 (twelve) hours for 2 days.   pantoprazole 40 MG tablet Commonly known as: PROTONIX Take 40 mg by mouth daily.       No Known Allergies  Consultations:  PCCM  Psychiatry   Procedures/Studies: DG CHEST PORT 1 VIEW  Result Date: 05/31/2020 CLINICAL DATA:  Hypoxia. EXAM: PORTABLE CHEST 1 VIEW COMPARISON:  May 30, 2020. FINDINGS: The heart size and mediastinal contours are within normal  limits. No pneumothorax or pleural effusion is noted. Stable bilateral lower lobe opacities are noted concerning for pneumonia or possibly edema. The visualized skeletal structures are unremarkable. IMPRESSION: Stable bilateral lower lobe opacities are noted concerning for pneumonia or possibly edema. Electronically Signed   By: Lupita Raider M.D.   On: 05/31/2020 08:55   DG Chest Port 1 View  Result Date: 05/30/2020 CLINICAL DATA:  Aspiration, drug overdose. EXAM: PORTABLE CHEST 1 VIEW COMPARISON:  None. FINDINGS: The heart size and mediastinal contours are within normal limits. Bilateral lower lung predominant airspace opacities are noted. A left pleural effusion may contribute. There is no right pleural effusion. There is no pneumothorax. The visualized skeletal structures are unremarkable. IMPRESSION: Bilateral lower lung predominant airspace opacities likely represent aspiration. Electronically Signed   By: Romona Curls M.D.   On: 05/30/2020 14:56     Subjective: Patient seen and examined at bedside, resting comfortably.  Sitter and RN present.  No complaints this morning.  Will be discharging to [facility] today.  Denies headache, no chest pain, no shortness of breath, no abdominal pain.  No acute events overnight per nursing staff.  Discharge Exam: Vitals:   06/03/20 0522 06/03/20 0744  BP:    Pulse: 74   Resp:    Temp: 98.5 F (36.9 C) 98.4 F (36.9 C)  SpO2:     Vitals:   06/02/20 2337 06/03/20 0324 06/03/20 0522 06/03/20 0744  BP:      Pulse:   74   Resp:      Temp: 98.3 F (36.8 C) 98.8 F (37.1 C) 98.5 F (36.9 C) 98.4 F (36.9 C)  TempSrc: Oral Oral Oral Oral  SpO2:      Weight:      Height:        General: Pt is alert, awake, not in acute distress Cardiovascular: RRR, S1/S2 +, no rubs, no gallops Respiratory: CTA bilaterally, no wheezing, no rhonchi, on room air Abdominal: Soft, NT, ND, bowel sounds + Extremities: no edema, no cyanosis    The  results of significant diagnostics from this hospitalization (including imaging, microbiology, ancillary and laboratory) are listed below for reference.     Microbiology: Recent Results (from the past 240 hour(s))  Resp Panel by RT-PCR (Flu A&B, Covid) Nasopharyngeal Swab     Status: None   Collection Time: 05/30/20 12:54 AM   Specimen: Nasopharyngeal Swab; Nasopharyngeal(NP) swabs in vial transport medium  Result Value Ref Range Status   SARS Coronavirus 2 by RT PCR NEGATIVE NEGATIVE Final    Comment: (NOTE) SARS-CoV-2 target nucleic acids are NOT DETECTED.  The SARS-CoV-2 RNA is generally detectable in upper respiratory specimens during the acute phase of infection. The lowest concentration of SARS-CoV-2 viral copies this assay can detect is 138 copies/mL. A negative result does not preclude SARS-Cov-2 infection and should not be used as the sole basis for treatment or other patient management decisions. A negative result may occur with  improper specimen collection/handling, submission of specimen other than nasopharyngeal swab, presence of viral mutation(s) within the areas targeted by this assay, and inadequate number of viral copies(<138 copies/mL). A negative result must be combined with clinical observations, patient history, and epidemiological information. The expected result is Negative.  Fact Sheet for Patients:  BloggerCourse.com  Fact Sheet for Healthcare Providers:  SeriousBroker.it  This test is no t yet  approved or cleared by the [place] and  has been authorized for detection and/or diagnosis of SARS-CoV-2 by FDA under an Emergency Use Authorization (EUA). This EUA will remain  in effect (meaning this test can be used) for the duration of the COVID-19 declaration under Section 564(b)(1) of the Act, 21 U.S.C.section 360bbb-3(b)(1), unless the authorization is terminated  or revoked sooner.        Influenza A by PCR NEGATIVE NEGATIVE Final   Influenza B by PCR NEGATIVE NEGATIVE Final    Comment: (NOTE) The Xpert Xpress SARS-CoV-2/FLU/RSV plus assay is intended as an aid in the diagnosis of influenza from Nasopharyngeal swab specimens and should not be used as a sole basis for treatment. Nasal washings and aspirates are unacceptable for Xpert Xpress SARS-CoV-2/FLU/RSV testing.  Fact Sheet for Patients: BloggerCourse.com  Fact Sheet for Healthcare Providers: SeriousBroker.it  This test is not yet approved or cleared by the [place] FDA and has been authorized for detection and/or diagnosis of SARS-CoV-2 by FDA under an Emergency Use Authorization (EUA). This EUA will remain in effect (meaning this test can be used) for the duration of the COVID-19 declaration under Section 564(b)(1) of the Act, 21 U.S.C. section 360bbb-3(b)(1), unless the authorization is terminated or revoked.  Performed at [facility], [address], [place], [place] [address]   MRSA PCR Screening     Status: None   Collection Time: 05/30/20  3:54 PM   Specimen: Nasopharyngeal  Result Value Ref Range Status   MRSA by PCR NEGATIVE NEGATIVE Final    Comment:        The GeneXpert MRSA Assay (FDA approved for NASAL specimens only), is one component of a comprehensive MRSA colonization surveillance program. It is not intended to diagnose MRSA infection nor to guide or monitor treatment for MRSA infections. Performed at [facility], [address] [place], [place] [address]   Culture, blood (routine x 2)     Status: None (Preliminary result)   Collection Time: 05/31/20  9:10 AM   Specimen: BLOOD LEFT HAND  Result Value Ref Range Status   Specimen Description BLOOD LEFT HAND  Final   Special Requests   Final    BOTTLES DRAWN AEROBIC AND ANAEROBIC Blood Culture adequate volume   Culture   Final    NO GROWTH 3 DAYS Performed at  [facility], [address] [place], [place] [address]    Report Status PENDING  Incomplete  Culture, blood (routine x 2)     Status: None (Preliminary result)   Collection Time: 05/31/20  9:10 AM   Specimen: BLOOD RIGHT HAND  Result Value Ref Range Status   Specimen Description BLOOD RIGHT HAND  Final   Special Requests   Final    BOTTLES DRAWN AEROBIC AND ANAEROBIC Blood Culture adequate volume   Culture   Final    NO GROWTH 3 DAYS Performed at [facility], [address] [place], [place] [address]    Report Status PENDING  Incomplete     Labs: BNP (last 3 results) No results for input(s): BNP in the last 8760 hours. Basic Metabolic Panel: Recent Labs  Lab 05/29/20 0013 05/29/20 2248 05/30/20 0805 05/30/20 1556 05/31/20 0308 06/01/20 0823 06/02/20 0038 06/03/20 0244  NA  --    < > 140 143 141 139 139 139  K  --    < > 3.9 3.8 3.9 3.5 3.3* 3.3*  CL  --    < >  108  --  114* 112* 112* 110  CO2  --    < > 23  --  22 23 22 22   GLUCOSE  --    < > 130*  --  117* 114* 106* 105*  BUN  --    < > 9  --  7 6 7 6   CREATININE  --    < > 0.82  --  0.90 0.74 0.81 0.74  CALCIUM  --    < > 8.8*  --  8.3* 8.5* 8.4* 8.6*  MG 2.0  --   --   --   --  1.9 1.6* 2.1   < > = values in this interval not displayed.   Liver Function Tests: Recent Labs  Lab 05/29/20 2248 05/31/20 0308 06/02/20 0038 06/03/20 0244  AST 31 19 65* 53*  ALT 34 22 65* 89*  ALKPHOS 43 34* 86 104  BILITOT 1.0 1.3* 1.3* 0.7  PROT 7.0 5.4* 5.6* 5.7*  ALBUMIN 4.2 3.0* 2.9* 3.0*   No results for input(s): LIPASE, AMYLASE in the last 168 hours. No results for input(s): AMMONIA in the last 168 hours. CBC: Recent Labs  Lab 05/29/20 2248 05/30/20 1556 05/31/20 0359 06/01/20 0823 06/02/20 0038 06/03/20 0244  WBC 7.4  --  13.2* 8.0 7.0 6.5  NEUTROABS  --   --   --   --  5.2 4.1  HGB 14.1 14.3 12.5 11.3* 11.6* 11.3*  HCT 42.8 42.0 39.6 35.1* 35.6* 34.7*  MCV 82.1  --  84.6 83.6 82.4 81.6  PLT  205  --  139* 125* 143* 158   Cardiac Enzymes: No results for input(s): CKTOTAL, CKMB, CKMBINDEX, TROPONINI in the last 168 hours. BNP: Invalid input(s): POCBNP CBG: Recent Labs  Lab 06/01/20 1929 06/01/20 2317 06/02/20 0315 06/02/20 0733 06/03/20 0104  GLUCAP 71 97 104* 95 100*   D-Dimer No results for input(s): DDIMER in the last 72 hours. Hgb A1c Recent Labs    06/01/20 0823  HGBA1C 5.3   Lipid Profile No results for input(s): CHOL, HDL, LDLCALC, TRIG, CHOLHDL, LDLDIRECT in the last 72 hours. Thyroid function studies Recent Labs    06/01/20 0823  TSH 2.846   Anemia work up No results for input(s): VITAMINB12, FOLATE, FERRITIN, TIBC, IRON, RETICCTPCT in the last 72 hours. Urinalysis    Component Value Date/Time   COLORURINE YELLOW 05/31/2020 0731   APPEARANCEUR CLEAR 05/31/2020 0731   LABSPEC 1.021 05/31/2020 0731   PHURINE 6.0 05/31/2020 0731   GLUCOSEU NEGATIVE 05/31/2020 0731   HGBUR NEGATIVE 05/31/2020 0731   BILIRUBINUR NEGATIVE 05/31/2020 0731   KETONESUR NEGATIVE 05/31/2020 0731   PROTEINUR NEGATIVE 05/31/2020 0731   NITRITE NEGATIVE 05/31/2020 0731   LEUKOCYTESUR NEGATIVE 05/31/2020 0731   Sepsis Labs Invalid input(s): PROCALCITONIN,  WBC,  LACTICIDVEN Microbiology Recent Results (from the past 240 hour(s))  Resp Panel by RT-PCR (Flu A&B, Covid) Nasopharyngeal Swab     Status: None   Collection Time: 05/30/20 12:54 AM   Specimen: Nasopharyngeal Swab; Nasopharyngeal(NP) swabs in vial transport medium  Result Value Ref Range Status   SARS Coronavirus 2 by RT PCR NEGATIVE NEGATIVE Final    Comment: (NOTE) SARS-CoV-2 target nucleic acids are NOT DETECTED.  The SARS-CoV-2 RNA is generally detectable in upper respiratory specimens during the acute phase of infection. The lowest concentration of SARS-CoV-2 viral copies this assay can detect is 138 copies/mL. A negative result does not preclude SARS-Cov-2 infection and should not be used as the sole  basis for treatment or other patient management decisions. A negative result may occur with  improper specimen collection/handling, submission of specimen other than nasopharyngeal swab, presence of viral mutation(s) within the areas targeted by this assay, and inadequate number of viral copies(<138 copies/mL). A negative result must be combined with clinical observations, patient history, and epidemiological information. The expected result is Negative.  Fact Sheet for Patients:  BloggerCourse.com  Fact Sheet for Healthcare Providers:  SeriousBroker.it  This test is no t yet approved or cleared by the [place] FDA and  has been authorized for detection and/or diagnosis of SARS-CoV-2 by FDA under an Emergency Use Authorization (EUA). This EUA will remain  in effect (meaning this test can be used) for the duration of the COVID-19 declaration under Section 564(b)(1) of the Act, 21 U.S.C.section 360bbb-3(b)(1), unless the authorization is terminated  or revoked sooner.       Influenza A by PCR NEGATIVE NEGATIVE Final   Influenza B by PCR NEGATIVE NEGATIVE Final    Comment: (NOTE) The Xpert Xpress SARS-CoV-2/FLU/RSV plus assay is intended as an aid in the diagnosis of influenza from Nasopharyngeal swab specimens and should not be used as a sole basis for treatment. Nasal washings and aspirates are unacceptable for Xpert Xpress SARS-CoV-2/FLU/RSV testing.  Fact Sheet for Patients: BloggerCourse.com  Fact Sheet for Healthcare Providers: SeriousBroker.it  This test is not yet approved or cleared by the [place] FDA and has been authorized for detection and/or diagnosis of SARS-CoV-2 by FDA under an Emergency Use Authorization (EUA). This EUA will remain in effect (meaning this test can be used) for the duration of the COVID-19 declaration under Section 564(b)(1) of the Act,  21 U.S.C. section 360bbb-3(b)(1), unless the authorization is terminated or revoked.  Performed at [facility], [address] [place], [place] [address]   MRSA PCR Screening     Status: None   Collection Time: 05/30/20  3:54 PM   Specimen: Nasopharyngeal  Result Value Ref Range Status   MRSA by PCR NEGATIVE NEGATIVE Final    Comment:        The GeneXpert MRSA Assay (FDA approved for NASAL specimens only), is one component of a comprehensive MRSA colonization surveillance program. It is not intended to diagnose MRSA infection nor to guide or monitor treatment for MRSA infections. Performed at [facility], [address], [place], [place] [address]   Culture, blood (routine x 2)     Status: None (Preliminary result)   Collection Time: 05/31/20  9:10 AM   Specimen: BLOOD LEFT HAND  Result Value Ref Range Status   Specimen Description BLOOD LEFT HAND  Final   Special Requests   Final    BOTTLES DRAWN AEROBIC AND ANAEROBIC Blood Culture adequate volume   Culture   Final    NO GROWTH 3 DAYS Performed at [facility], [address], [place], [place] [address]    Report Status PENDING  Incomplete  Culture, blood (routine x 2)     Status: None (Preliminary result)   Collection Time: 05/31/20  9:10 AM   Specimen: BLOOD RIGHT HAND  Result Value Ref Range Status   Specimen Description BLOOD RIGHT HAND  Final   Special Requests   Final    BOTTLES DRAWN AEROBIC AND ANAEROBIC Blood Culture adequate volume   Culture   Final    NO GROWTH 3 DAYS Performed at [facility], [address], [place], [place] [address]  Report Status PENDING  Incomplete     Time coordinating discharge: Over 30 minutes  SIGNED:   Alvira Philips [place], DO  [facility] 06/03/2020, 10:07 AM

## 2020-06-03 NOTE — Progress Notes (Addendum)
11am: CSW informed patient of discharge plan at bedside. Patient agreeable and understands she will be transported via [facility] Department.  9:20am: CSW received return call from admissions at [facility] - the facility can offer a bed.  The accepting physician is Dr. Beaulah Corin. The patient will go to the [facility] unit. The number to call for report is [phone].  The patient will be transported via [facility] - transport has been called.  Bonita Quin, RN aware of plan.  8am: CSW contacted the following facilities to determine bed availability:  [facility] - no beds [facility] - no beds [facility] - no answer [facility] - has beds, waiting for return call from admissions [facility] - no answer [facility] - resent referral [facility] - no answer  Edwin Dada, MSW, LCSW Transitions of Care  Clinical Social Worker II [phone]

## 2020-06-03 NOTE — Progress Notes (Signed)
Pt transferred to [facility]  in the custody of [facility].  IVC admission.

## 2020-06-03 NOTE — H&P (Signed)
  For H&P note, please see PCCM consult note on 3/20 at 1436.      Posey Boyer, ACNP [facility]

## 2020-06-05 LAB — CULTURE, BLOOD (ROUTINE X 2)
Culture: NO GROWTH
Culture: NO GROWTH
Special Requests: ADEQUATE
Special Requests: ADEQUATE

## 2020-06-09 DIAGNOSIS — F332 Major depressive disorder, recurrent severe without psychotic features: Secondary | ICD-10-CM | POA: Diagnosis not present

## 2020-06-15 ENCOUNTER — Ambulatory Visit (HOSPITAL_COMMUNITY): Payer: BC Managed Care – PPO | Admitting: Clinical

## 2020-06-29 ENCOUNTER — Ambulatory Visit (HOSPITAL_COMMUNITY): Payer: BC Managed Care – PPO | Admitting: Clinical

## 2020-12-09 DIAGNOSIS — G43101 Migraine with aura, not intractable, with status migrainosus: Secondary | ICD-10-CM | POA: Diagnosis not present

## 2020-12-09 DIAGNOSIS — F411 Generalized anxiety disorder: Secondary | ICD-10-CM | POA: Diagnosis not present

## 2020-12-09 DIAGNOSIS — F3341 Major depressive disorder, recurrent, in partial remission: Secondary | ICD-10-CM | POA: Diagnosis not present

## 2021-02-01 DIAGNOSIS — N3946 Mixed incontinence: Secondary | ICD-10-CM | POA: Diagnosis not present

## 2021-04-08 DIAGNOSIS — S81811A Laceration without foreign body, right lower leg, initial encounter: Secondary | ICD-10-CM | POA: Diagnosis not present

## 2021-05-17 DIAGNOSIS — R110 Nausea: Secondary | ICD-10-CM | POA: Diagnosis not present

## 2021-05-17 DIAGNOSIS — B359 Dermatophytosis, unspecified: Secondary | ICD-10-CM | POA: Diagnosis not present

## 2021-05-17 DIAGNOSIS — F3341 Major depressive disorder, recurrent, in partial remission: Secondary | ICD-10-CM | POA: Diagnosis not present

## 2021-05-17 DIAGNOSIS — D509 Iron deficiency anemia, unspecified: Secondary | ICD-10-CM | POA: Diagnosis not present

## 2021-05-17 DIAGNOSIS — R5383 Other fatigue: Secondary | ICD-10-CM | POA: Diagnosis not present

## 2021-06-03 DIAGNOSIS — M7061 Trochanteric bursitis, right hip: Secondary | ICD-10-CM | POA: Diagnosis not present

## 2021-06-03 DIAGNOSIS — M7062 Trochanteric bursitis, left hip: Secondary | ICD-10-CM | POA: Diagnosis not present

## 2021-06-03 DIAGNOSIS — L309 Dermatitis, unspecified: Secondary | ICD-10-CM | POA: Diagnosis not present

## 2021-06-03 DIAGNOSIS — L920 Granuloma annulare: Secondary | ICD-10-CM | POA: Diagnosis not present

## 2021-06-30 DIAGNOSIS — F332 Major depressive disorder, recurrent severe without psychotic features: Secondary | ICD-10-CM | POA: Diagnosis not present

## 2021-06-30 DIAGNOSIS — F411 Generalized anxiety disorder: Secondary | ICD-10-CM | POA: Diagnosis not present

## 2021-07-05 DIAGNOSIS — L920 Granuloma annulare: Secondary | ICD-10-CM | POA: Diagnosis not present

## 2021-07-19 DIAGNOSIS — F332 Major depressive disorder, recurrent severe without psychotic features: Secondary | ICD-10-CM | POA: Diagnosis not present

## 2021-07-19 DIAGNOSIS — F411 Generalized anxiety disorder: Secondary | ICD-10-CM | POA: Diagnosis not present

## 2021-07-19 DIAGNOSIS — F603 Borderline personality disorder: Secondary | ICD-10-CM | POA: Diagnosis not present

## 2021-07-21 DIAGNOSIS — F603 Borderline personality disorder: Secondary | ICD-10-CM | POA: Diagnosis not present

## 2021-07-21 DIAGNOSIS — F332 Major depressive disorder, recurrent severe without psychotic features: Secondary | ICD-10-CM | POA: Diagnosis not present

## 2021-07-21 DIAGNOSIS — F411 Generalized anxiety disorder: Secondary | ICD-10-CM | POA: Diagnosis not present

## 2021-07-21 DIAGNOSIS — R454 Irritability and anger: Secondary | ICD-10-CM | POA: Diagnosis not present

## 2021-07-28 DIAGNOSIS — F411 Generalized anxiety disorder: Secondary | ICD-10-CM | POA: Diagnosis not present

## 2021-07-28 DIAGNOSIS — F332 Major depressive disorder, recurrent severe without psychotic features: Secondary | ICD-10-CM | POA: Diagnosis not present

## 2021-07-28 DIAGNOSIS — F603 Borderline personality disorder: Secondary | ICD-10-CM | POA: Diagnosis not present

## 2021-07-28 DIAGNOSIS — R454 Irritability and anger: Secondary | ICD-10-CM | POA: Diagnosis not present

## 2021-08-09 DIAGNOSIS — F603 Borderline personality disorder: Secondary | ICD-10-CM | POA: Diagnosis not present

## 2021-08-09 DIAGNOSIS — F411 Generalized anxiety disorder: Secondary | ICD-10-CM | POA: Diagnosis not present

## 2021-08-09 DIAGNOSIS — F332 Major depressive disorder, recurrent severe without psychotic features: Secondary | ICD-10-CM | POA: Diagnosis not present

## 2021-08-18 DIAGNOSIS — F411 Generalized anxiety disorder: Secondary | ICD-10-CM | POA: Diagnosis not present

## 2021-08-18 DIAGNOSIS — R454 Irritability and anger: Secondary | ICD-10-CM | POA: Diagnosis not present

## 2021-08-18 DIAGNOSIS — F332 Major depressive disorder, recurrent severe without psychotic features: Secondary | ICD-10-CM | POA: Diagnosis not present

## 2021-08-18 DIAGNOSIS — F603 Borderline personality disorder: Secondary | ICD-10-CM | POA: Diagnosis not present

## 2021-08-19 DIAGNOSIS — F411 Generalized anxiety disorder: Secondary | ICD-10-CM | POA: Diagnosis not present

## 2021-08-19 DIAGNOSIS — F332 Major depressive disorder, recurrent severe without psychotic features: Secondary | ICD-10-CM | POA: Diagnosis not present

## 2021-08-30 DIAGNOSIS — F332 Major depressive disorder, recurrent severe without psychotic features: Secondary | ICD-10-CM | POA: Diagnosis not present

## 2021-08-30 DIAGNOSIS — F411 Generalized anxiety disorder: Secondary | ICD-10-CM | POA: Diagnosis not present

## 2021-09-09 DIAGNOSIS — F411 Generalized anxiety disorder: Secondary | ICD-10-CM | POA: Diagnosis not present

## 2021-09-09 DIAGNOSIS — F332 Major depressive disorder, recurrent severe without psychotic features: Secondary | ICD-10-CM | POA: Diagnosis not present

## 2021-09-12 DIAGNOSIS — F332 Major depressive disorder, recurrent severe without psychotic features: Secondary | ICD-10-CM | POA: Diagnosis not present

## 2021-09-12 DIAGNOSIS — F603 Borderline personality disorder: Secondary | ICD-10-CM | POA: Diagnosis not present

## 2021-09-12 DIAGNOSIS — F411 Generalized anxiety disorder: Secondary | ICD-10-CM | POA: Diagnosis not present

## 2021-09-12 DIAGNOSIS — R454 Irritability and anger: Secondary | ICD-10-CM | POA: Diagnosis not present

## 2021-09-20 DIAGNOSIS — R454 Irritability and anger: Secondary | ICD-10-CM | POA: Diagnosis not present

## 2021-09-20 DIAGNOSIS — F332 Major depressive disorder, recurrent severe without psychotic features: Secondary | ICD-10-CM | POA: Diagnosis not present

## 2021-09-20 DIAGNOSIS — F411 Generalized anxiety disorder: Secondary | ICD-10-CM | POA: Diagnosis not present

## 2021-09-21 ENCOUNTER — Encounter: Payer: BC Managed Care – PPO | Attending: Family Medicine | Admitting: Dietician

## 2021-09-21 ENCOUNTER — Encounter: Payer: Self-pay | Admitting: Dietician

## 2021-09-21 VITALS — Ht 72.0 in | Wt 207.0 lb

## 2021-09-21 DIAGNOSIS — E663 Overweight: Secondary | ICD-10-CM

## 2021-09-21 DIAGNOSIS — Z6828 Body mass index (BMI) 28.0-28.9, adult: Secondary | ICD-10-CM | POA: Diagnosis not present

## 2021-09-21 NOTE — Patient Instructions (Signed)
Gradually increase daily calorie intake with some healthy food options. Start with a light breakfast during break at work.  Add 1 carb serving at a time, slowly working up to at least 3 servings with each meal.  Track food intake, at least for 1-2 weeks, to determine current intake. Then move closer to the 1700 calorie goal over several weeks/ months.

## 2021-09-21 NOTE — Progress Notes (Signed)
Medical Nutrition Therapy: Visit start time: 1315  end time: 1415  Assessment:   Referral Diagnosis: overweight Other medical history/ diagnoses: cluster headaches; migraines; bipolar disorder Psychosocial issues/ stress concerns: bipolar disorder, taking meds and seeing therapist  Medications, supplements: reconciled list in medical record   Preferred learning method:  No preference indicated   Current weight: 207.0lbs Height: 6'0" BMI: 28.07 Patient's personal weight goal: 170- 180lbs (BMI 23-24)  Progress and evaluation:  Patient reports weight was about 216lbs several weeks ago. She usually is able to lose down to present weight and then hits plateau.  She was last at her goal weight 10-12 years ago. She reports typically eating 1 meal daily, due to lack of hunger during the day. She has been limiting carbs (was following keto diet for a time) and controlling food portions, and working on kcal control Food allergies: none known Special diet practices: no Patient seeks help with overcoming weight loss plateau, guidance with appropriate caloric and food intake    Dietary Intake:  Usual eating pattern includes 1-2 meals and 0-1 snacks per day. Dining out frequency: 1-2 meals per week. Who plans meals/ buys groceries? Self, spouse Who prepares meals? Self, spouse  Breakfast: usually skips; rarely few hash rounds at work; usually not hungry Snack: none Lunch: usually skips; 7/11-- chicken filet with lettuce, cheese Snack: rare; 1/2 - 1 special k cereal bar Supper: 5pm -8pm -- fajitas with zero carb wrap; salad with dessert (splurge); chicken nuggets and chips Snack: none Beverages: water, sugar free drinks; energy drink maybe 1-2x a month  Physical activity: no regular activity; active job at [employer] center   Intervention:   Nutrition Care Education:   Basic nutrition: basic food groups; appropriate nutrient balance; appropriate meal and snack schedule; general  nutrition guidelines    Weight control: determining reasonable weight loss rate; importance of low sugar and low fat choices; appropriate food portions; estimated energy needs for weight loss at 1700-1800 kcal, provided guidance for 40-45% CHO, 25-30% pro, 30% fat; avoiding over-restricting calories and carbs; benefit of eating at regular intervals; role of physical activity; potential effects of medication(s); tracking food intake   Other intervention notes: Patient has been working on dietary changes to promote weight loss; she is motivated to continue Established goals to gradually reach caloric goal and ensure adequate daily nutrition, with patient direction. Patient requested follow up visit in September.   Nutritional Diagnosis:  [place]-3.3 Overweight/obesity As related to history of excess calories, frequent skipped meals, possible medication effects.  As evidenced by patient with current BMI of 28, working on diet and lifestyle changes to promote weight loss.   Education Materials given:  Designer, industrial/product with food lists, sample meal pattern Sample menus Visit summary with goals/ instructions   Learner/ who was taught:  Patient    Level of understanding: Verbalizes/ demonstrates competency   Demonstrated degree of understanding via:   Teach back Learning barriers: None   Willingness to learn/ readiness for change: Eager, change in progress   Monitoring and Evaluation:  Dietary intake, exercise, and body weight      follow up:  11/23/21 at 2:00pm

## 2021-10-11 DIAGNOSIS — F332 Major depressive disorder, recurrent severe without psychotic features: Secondary | ICD-10-CM | POA: Diagnosis not present

## 2021-10-11 DIAGNOSIS — F411 Generalized anxiety disorder: Secondary | ICD-10-CM | POA: Diagnosis not present

## 2021-10-20 DIAGNOSIS — F411 Generalized anxiety disorder: Secondary | ICD-10-CM | POA: Diagnosis not present

## 2021-10-20 DIAGNOSIS — F332 Major depressive disorder, recurrent severe without psychotic features: Secondary | ICD-10-CM | POA: Diagnosis not present

## 2021-10-20 DIAGNOSIS — R454 Irritability and anger: Secondary | ICD-10-CM | POA: Diagnosis not present

## 2021-10-20 DIAGNOSIS — F603 Borderline personality disorder: Secondary | ICD-10-CM | POA: Diagnosis not present

## 2021-10-31 DIAGNOSIS — F332 Major depressive disorder, recurrent severe without psychotic features: Secondary | ICD-10-CM | POA: Diagnosis not present

## 2021-10-31 DIAGNOSIS — F411 Generalized anxiety disorder: Secondary | ICD-10-CM | POA: Diagnosis not present

## 2021-11-02 ENCOUNTER — Ambulatory Visit: Payer: BC Managed Care – PPO | Admitting: Registered"

## 2021-11-11 DIAGNOSIS — F411 Generalized anxiety disorder: Secondary | ICD-10-CM | POA: Diagnosis not present

## 2021-11-11 DIAGNOSIS — F603 Borderline personality disorder: Secondary | ICD-10-CM | POA: Diagnosis not present

## 2021-11-11 DIAGNOSIS — F332 Major depressive disorder, recurrent severe without psychotic features: Secondary | ICD-10-CM | POA: Diagnosis not present

## 2021-11-23 ENCOUNTER — Ambulatory Visit: Payer: BC Managed Care – PPO | Admitting: Dietician

## 2021-11-23 DIAGNOSIS — F332 Major depressive disorder, recurrent severe without psychotic features: Secondary | ICD-10-CM | POA: Diagnosis not present

## 2021-11-23 DIAGNOSIS — F411 Generalized anxiety disorder: Secondary | ICD-10-CM | POA: Diagnosis not present

## 2021-12-13 DIAGNOSIS — F411 Generalized anxiety disorder: Secondary | ICD-10-CM | POA: Diagnosis not present

## 2021-12-13 DIAGNOSIS — F3341 Major depressive disorder, recurrent, in partial remission: Secondary | ICD-10-CM | POA: Diagnosis not present

## 2021-12-13 DIAGNOSIS — G43101 Migraine with aura, not intractable, with status migrainosus: Secondary | ICD-10-CM | POA: Diagnosis not present

## 2021-12-21 ENCOUNTER — Encounter: Payer: Self-pay | Admitting: Dietician

## 2021-12-21 NOTE — Progress Notes (Signed)
Have not heard back from patient to reschedule her missed appointment from 11/23/21. Sent notification to referring provider.

## 2021-12-26 DIAGNOSIS — F603 Borderline personality disorder: Secondary | ICD-10-CM | POA: Diagnosis not present

## 2021-12-26 DIAGNOSIS — F411 Generalized anxiety disorder: Secondary | ICD-10-CM | POA: Diagnosis not present

## 2021-12-26 DIAGNOSIS — F332 Major depressive disorder, recurrent severe without psychotic features: Secondary | ICD-10-CM | POA: Diagnosis not present

## 2022-01-12 DIAGNOSIS — F411 Generalized anxiety disorder: Secondary | ICD-10-CM | POA: Diagnosis not present

## 2022-01-12 DIAGNOSIS — F332 Major depressive disorder, recurrent severe without psychotic features: Secondary | ICD-10-CM | POA: Diagnosis not present

## 2022-01-12 DIAGNOSIS — R454 Irritability and anger: Secondary | ICD-10-CM | POA: Diagnosis not present

## 2022-01-12 DIAGNOSIS — F603 Borderline personality disorder: Secondary | ICD-10-CM | POA: Diagnosis not present

## 2022-02-06 ENCOUNTER — Telehealth: Payer: Self-pay | Admitting: Orthopaedic Surgery

## 2022-02-06 NOTE — Telephone Encounter (Signed)
Received vm from patient. She is requesting records be sent to another doctor. I emailed authorization form to pt per her request. I will process request upon receipt of completed auth.

## 2022-02-10 DIAGNOSIS — F411 Generalized anxiety disorder: Secondary | ICD-10-CM | POA: Diagnosis not present

## 2022-02-10 DIAGNOSIS — R454 Irritability and anger: Secondary | ICD-10-CM | POA: Diagnosis not present

## 2022-02-10 DIAGNOSIS — F603 Borderline personality disorder: Secondary | ICD-10-CM | POA: Diagnosis not present

## 2022-02-10 DIAGNOSIS — F332 Major depressive disorder, recurrent severe without psychotic features: Secondary | ICD-10-CM | POA: Diagnosis not present

## 2022-02-17 DIAGNOSIS — F332 Major depressive disorder, recurrent severe without psychotic features: Secondary | ICD-10-CM | POA: Diagnosis not present

## 2022-02-17 DIAGNOSIS — F411 Generalized anxiety disorder: Secondary | ICD-10-CM | POA: Diagnosis not present

## 2022-02-17 DIAGNOSIS — F603 Borderline personality disorder: Secondary | ICD-10-CM | POA: Diagnosis not present

## 2022-02-23 DIAGNOSIS — M545 Low back pain, unspecified: Secondary | ICD-10-CM | POA: Diagnosis not present

## 2022-02-23 DIAGNOSIS — M5459 Other low back pain: Secondary | ICD-10-CM | POA: Diagnosis not present

## 2022-02-23 DIAGNOSIS — F603 Borderline personality disorder: Secondary | ICD-10-CM | POA: Diagnosis not present

## 2022-02-23 DIAGNOSIS — F900 Attention-deficit hyperactivity disorder, predominantly inattentive type: Secondary | ICD-10-CM | POA: Diagnosis not present

## 2022-02-23 DIAGNOSIS — G8929 Other chronic pain: Secondary | ICD-10-CM | POA: Diagnosis not present

## 2022-02-28 ENCOUNTER — Other Ambulatory Visit: Payer: Self-pay | Admitting: Orthopedic Surgery

## 2022-02-28 DIAGNOSIS — M545 Low back pain, unspecified: Secondary | ICD-10-CM

## 2022-02-28 DIAGNOSIS — M5459 Other low back pain: Secondary | ICD-10-CM

## 2022-03-16 DIAGNOSIS — F900 Attention-deficit hyperactivity disorder, predominantly inattentive type: Secondary | ICD-10-CM | POA: Diagnosis not present

## 2022-03-16 DIAGNOSIS — F332 Major depressive disorder, recurrent severe without psychotic features: Secondary | ICD-10-CM | POA: Diagnosis not present

## 2022-03-16 DIAGNOSIS — F411 Generalized anxiety disorder: Secondary | ICD-10-CM | POA: Diagnosis not present

## 2022-03-16 DIAGNOSIS — F603 Borderline personality disorder: Secondary | ICD-10-CM | POA: Diagnosis not present

## 2022-03-28 ENCOUNTER — Ambulatory Visit
Admission: RE | Admit: 2022-03-28 | Discharge: 2022-03-28 | Disposition: A | Discharge status: Home / Self Care | Payer: BC Managed Care – PPO | Source: Ambulatory Visit | Attending: Orthopedic Surgery | Admitting: Orthopedic Surgery

## 2022-03-28 DIAGNOSIS — G8929 Other chronic pain: Secondary | ICD-10-CM

## 2022-03-28 DIAGNOSIS — M5459 Other low back pain: Secondary | ICD-10-CM

## 2022-03-28 DIAGNOSIS — M4807 Spinal stenosis, lumbosacral region: Secondary | ICD-10-CM | POA: Diagnosis not present

## 2022-03-28 DIAGNOSIS — M47816 Spondylosis without myelopathy or radiculopathy, lumbar region: Secondary | ICD-10-CM | POA: Diagnosis not present

## 2022-03-28 DIAGNOSIS — M545 Low back pain, unspecified: Secondary | ICD-10-CM | POA: Diagnosis not present

## 2022-03-28 DIAGNOSIS — M48061 Spinal stenosis, lumbar region without neurogenic claudication: Secondary | ICD-10-CM | POA: Diagnosis not present

## 2022-04-07 DIAGNOSIS — M5416 Radiculopathy, lumbar region: Secondary | ICD-10-CM | POA: Diagnosis not present

## 2022-04-07 DIAGNOSIS — M5126 Other intervertebral disc displacement, lumbar region: Secondary | ICD-10-CM | POA: Diagnosis not present

## 2022-04-07 DIAGNOSIS — M5136 Other intervertebral disc degeneration, lumbar region: Secondary | ICD-10-CM | POA: Diagnosis not present

## 2022-04-18 DIAGNOSIS — F411 Generalized anxiety disorder: Secondary | ICD-10-CM | POA: Diagnosis not present

## 2022-04-18 DIAGNOSIS — F603 Borderline personality disorder: Secondary | ICD-10-CM | POA: Diagnosis not present

## 2022-04-18 DIAGNOSIS — F900 Attention-deficit hyperactivity disorder, predominantly inattentive type: Secondary | ICD-10-CM | POA: Diagnosis not present

## 2022-04-27 DIAGNOSIS — M5416 Radiculopathy, lumbar region: Secondary | ICD-10-CM | POA: Diagnosis not present

## 2022-04-27 DIAGNOSIS — M5126 Other intervertebral disc displacement, lumbar region: Secondary | ICD-10-CM | POA: Diagnosis not present

## 2022-05-18 DIAGNOSIS — F411 Generalized anxiety disorder: Secondary | ICD-10-CM | POA: Diagnosis not present

## 2022-05-18 DIAGNOSIS — M5416 Radiculopathy, lumbar region: Secondary | ICD-10-CM | POA: Diagnosis not present

## 2022-05-18 DIAGNOSIS — F900 Attention-deficit hyperactivity disorder, predominantly inattentive type: Secondary | ICD-10-CM | POA: Diagnosis not present

## 2022-05-18 DIAGNOSIS — F603 Borderline personality disorder: Secondary | ICD-10-CM | POA: Diagnosis not present

## 2022-05-18 DIAGNOSIS — M5136 Other intervertebral disc degeneration, lumbar region: Secondary | ICD-10-CM | POA: Diagnosis not present

## 2022-05-18 DIAGNOSIS — M5126 Other intervertebral disc displacement, lumbar region: Secondary | ICD-10-CM | POA: Diagnosis not present

## 2022-05-18 DIAGNOSIS — F332 Major depressive disorder, recurrent severe without psychotic features: Secondary | ICD-10-CM | POA: Diagnosis not present

## 2022-06-12 DIAGNOSIS — E782 Mixed hyperlipidemia: Secondary | ICD-10-CM | POA: Diagnosis not present

## 2022-06-12 DIAGNOSIS — Z0000 Encounter for general adult medical examination without abnormal findings: Secondary | ICD-10-CM | POA: Diagnosis not present

## 2022-06-12 DIAGNOSIS — K219 Gastro-esophageal reflux disease without esophagitis: Secondary | ICD-10-CM | POA: Diagnosis not present

## 2022-06-12 DIAGNOSIS — F411 Generalized anxiety disorder: Secondary | ICD-10-CM | POA: Diagnosis not present

## 2022-06-12 DIAGNOSIS — F3341 Major depressive disorder, recurrent, in partial remission: Secondary | ICD-10-CM | POA: Diagnosis not present

## 2022-06-12 DIAGNOSIS — D509 Iron deficiency anemia, unspecified: Secondary | ICD-10-CM | POA: Diagnosis not present

## 2022-06-29 DIAGNOSIS — F900 Attention-deficit hyperactivity disorder, predominantly inattentive type: Secondary | ICD-10-CM | POA: Diagnosis not present

## 2022-06-29 DIAGNOSIS — F332 Major depressive disorder, recurrent severe without psychotic features: Secondary | ICD-10-CM | POA: Diagnosis not present

## 2022-06-29 DIAGNOSIS — F411 Generalized anxiety disorder: Secondary | ICD-10-CM | POA: Diagnosis not present

## 2022-08-24 DIAGNOSIS — F411 Generalized anxiety disorder: Secondary | ICD-10-CM | POA: Diagnosis not present

## 2022-08-24 DIAGNOSIS — F332 Major depressive disorder, recurrent severe without psychotic features: Secondary | ICD-10-CM | POA: Diagnosis not present

## 2022-08-24 DIAGNOSIS — F900 Attention-deficit hyperactivity disorder, predominantly inattentive type: Secondary | ICD-10-CM | POA: Diagnosis not present

## 2022-08-24 DIAGNOSIS — F603 Borderline personality disorder: Secondary | ICD-10-CM | POA: Diagnosis not present

## 2022-10-19 DIAGNOSIS — F900 Attention-deficit hyperactivity disorder, predominantly inattentive type: Secondary | ICD-10-CM | POA: Diagnosis not present

## 2022-10-19 DIAGNOSIS — F603 Borderline personality disorder: Secondary | ICD-10-CM | POA: Diagnosis not present

## 2022-10-19 DIAGNOSIS — F332 Major depressive disorder, recurrent severe without psychotic features: Secondary | ICD-10-CM | POA: Diagnosis not present

## 2022-10-19 DIAGNOSIS — F411 Generalized anxiety disorder: Secondary | ICD-10-CM | POA: Diagnosis not present

## 2022-10-31 DIAGNOSIS — F332 Major depressive disorder, recurrent severe without psychotic features: Secondary | ICD-10-CM | POA: Diagnosis not present

## 2022-10-31 DIAGNOSIS — F411 Generalized anxiety disorder: Secondary | ICD-10-CM | POA: Diagnosis not present

## 2022-10-31 DIAGNOSIS — F900 Attention-deficit hyperactivity disorder, predominantly inattentive type: Secondary | ICD-10-CM | POA: Diagnosis not present

## 2022-10-31 DIAGNOSIS — F603 Borderline personality disorder: Secondary | ICD-10-CM | POA: Diagnosis not present

## 2022-11-28 DIAGNOSIS — F900 Attention-deficit hyperactivity disorder, predominantly inattentive type: Secondary | ICD-10-CM | POA: Diagnosis not present

## 2022-11-28 DIAGNOSIS — F332 Major depressive disorder, recurrent severe without psychotic features: Secondary | ICD-10-CM | POA: Diagnosis not present

## 2022-11-28 DIAGNOSIS — F411 Generalized anxiety disorder: Secondary | ICD-10-CM | POA: Diagnosis not present

## 2023-01-16 DIAGNOSIS — F332 Major depressive disorder, recurrent severe without psychotic features: Secondary | ICD-10-CM | POA: Diagnosis not present

## 2023-01-16 DIAGNOSIS — F603 Borderline personality disorder: Secondary | ICD-10-CM | POA: Diagnosis not present

## 2023-01-16 DIAGNOSIS — F411 Generalized anxiety disorder: Secondary | ICD-10-CM | POA: Diagnosis not present

## 2023-01-16 DIAGNOSIS — F900 Attention-deficit hyperactivity disorder, predominantly inattentive type: Secondary | ICD-10-CM | POA: Diagnosis not present

## 2023-02-01 IMAGING — DX DG CHEST 1V PORT
1 series · 1 of 1 positions shown · non-contrast
Comparison: None.

CLINICAL DATA: Aspiration, drug overdose.

EXAM:
PORTABLE CHEST 1 VIEW

[chest ap]
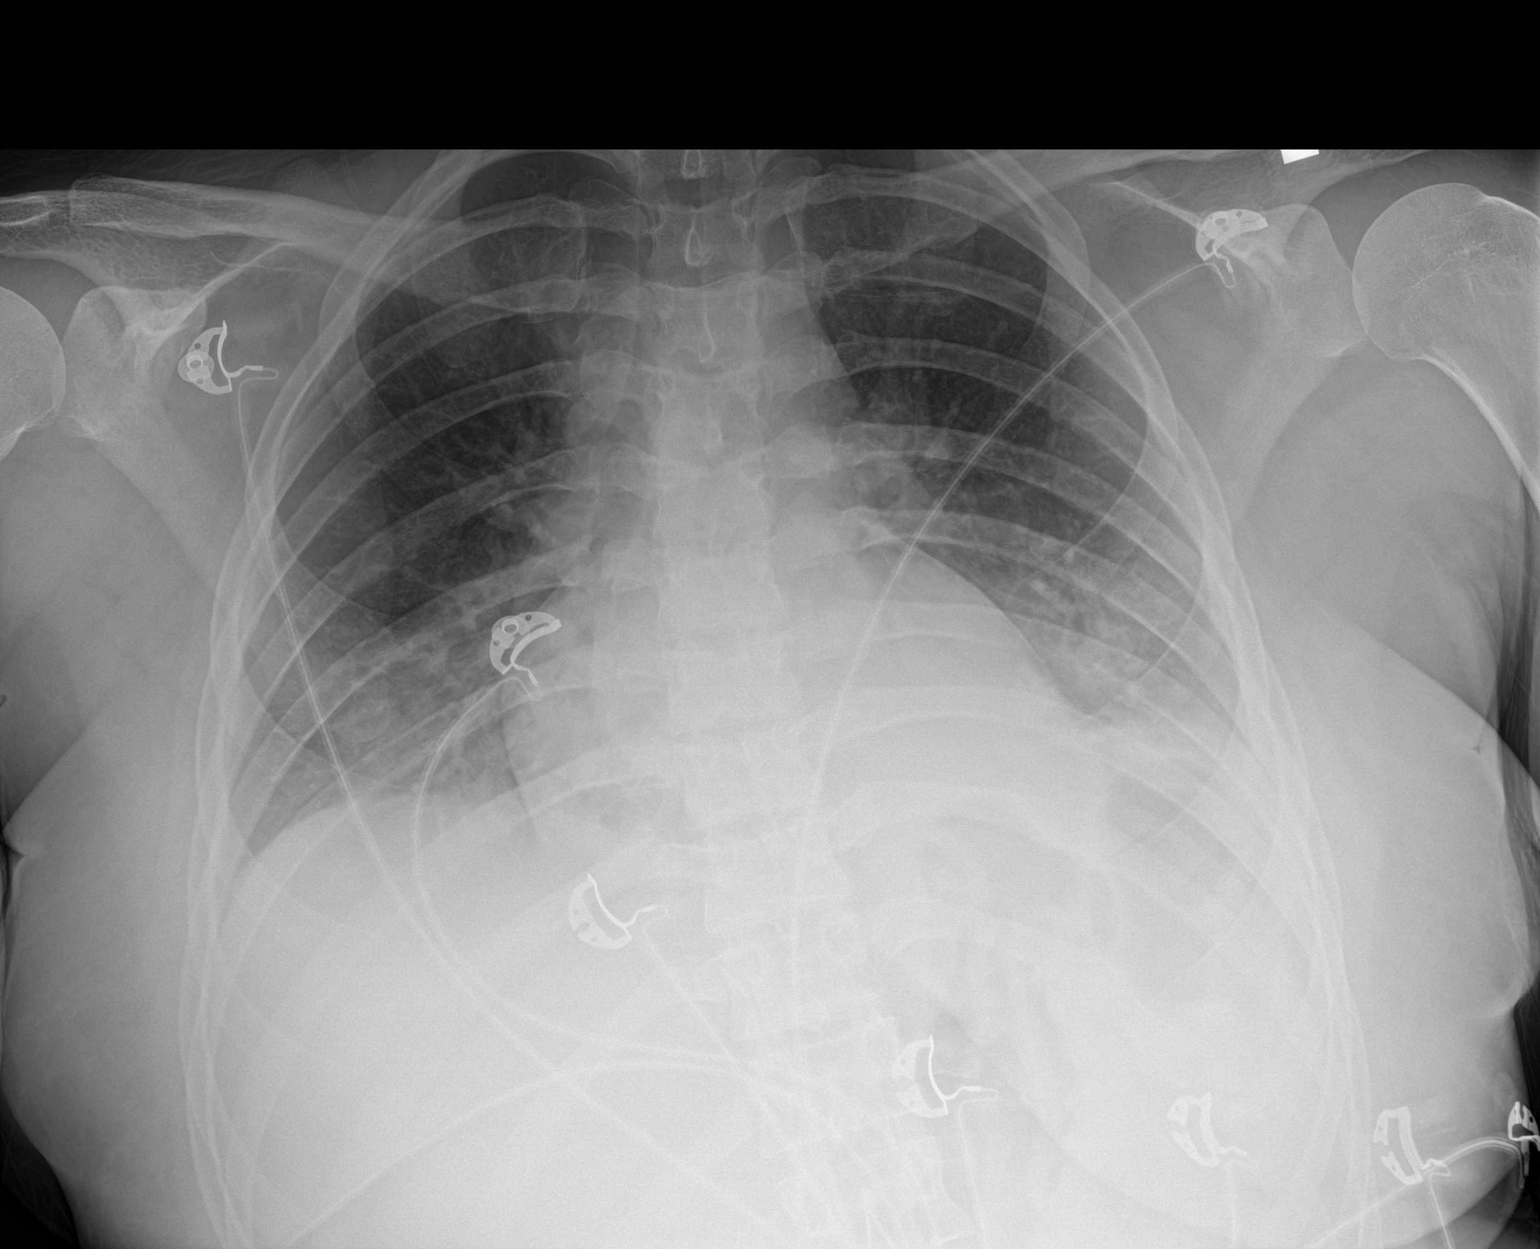

[1 of 1 positions shown; findings below may reference images not displayed]

FINDINGS: The heart size and mediastinal contours are within normal limits.
Bilateral lower lung predominant airspace opacities are noted. A
left pleural effusion may contribute. There is no right pleural
effusion. There is no pneumothorax. The visualized skeletal
structures are unremarkable.
IMPRESSION: Bilateral lower lung predominant airspace opacities likely represent
aspiration.

## 2023-02-02 IMAGING — DX DG CHEST 1V PORT
1 series · 1 of 1 positions shown · non-contrast
Comparison: May 30, 2020.

CLINICAL DATA: Hypoxia.

EXAM:
PORTABLE CHEST 1 VIEW

[chest ap]
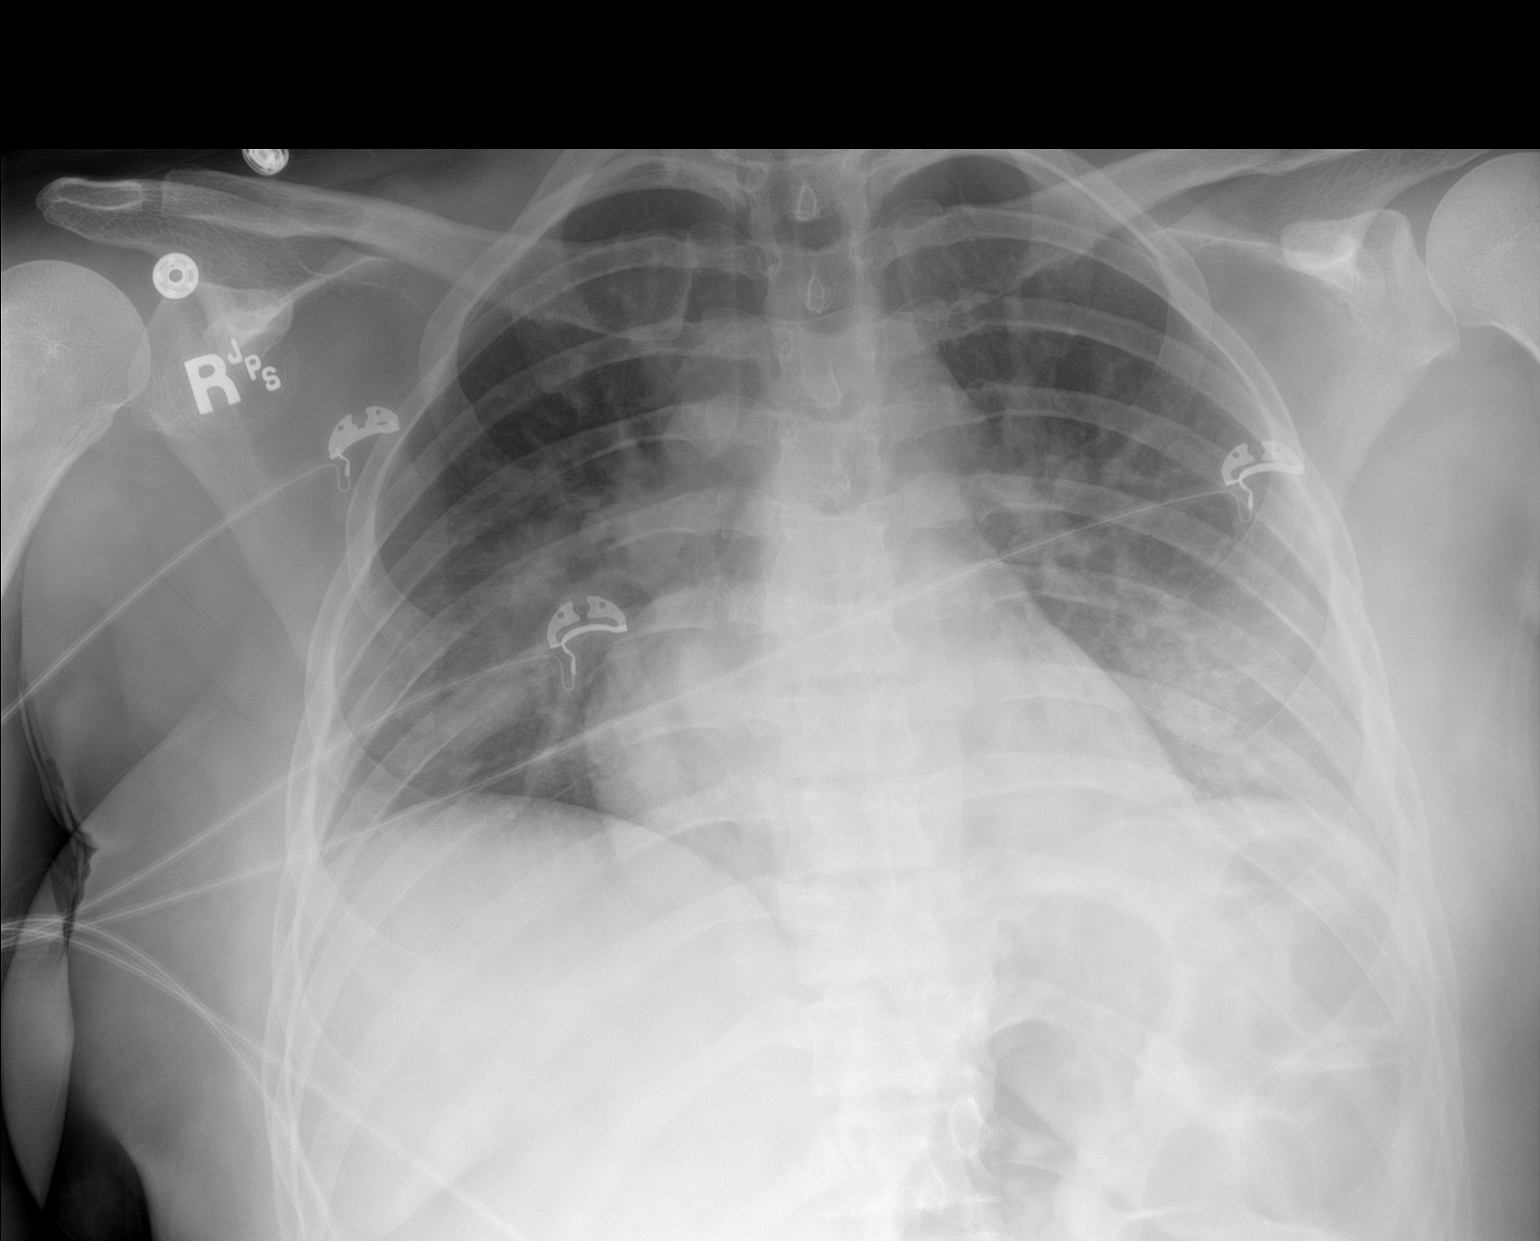

[1 of 1 positions shown; findings below may reference images not displayed]

FINDINGS: The heart size and mediastinal contours are within normal limits. No
pneumothorax or pleural effusion is noted. Stable bilateral lower
lobe opacities are noted concerning for pneumonia or possibly edema.
The visualized skeletal structures are unremarkable.
IMPRESSION: Stable bilateral lower lobe opacities are noted concerning for
pneumonia or possibly edema.

## 2023-02-20 DIAGNOSIS — F411 Generalized anxiety disorder: Secondary | ICD-10-CM | POA: Diagnosis not present

## 2023-02-20 DIAGNOSIS — F900 Attention-deficit hyperactivity disorder, predominantly inattentive type: Secondary | ICD-10-CM | POA: Diagnosis not present

## 2023-02-20 DIAGNOSIS — F332 Major depressive disorder, recurrent severe without psychotic features: Secondary | ICD-10-CM | POA: Diagnosis not present

## 2023-03-09 DIAGNOSIS — R350 Frequency of micturition: Secondary | ICD-10-CM | POA: Diagnosis not present

## 2023-03-09 DIAGNOSIS — R8271 Bacteriuria: Secondary | ICD-10-CM | POA: Diagnosis not present

## 2023-03-09 DIAGNOSIS — N3946 Mixed incontinence: Secondary | ICD-10-CM | POA: Diagnosis not present

## 2023-04-09 DIAGNOSIS — F411 Generalized anxiety disorder: Secondary | ICD-10-CM | POA: Diagnosis not present

## 2023-04-09 DIAGNOSIS — F900 Attention-deficit hyperactivity disorder, predominantly inattentive type: Secondary | ICD-10-CM | POA: Diagnosis not present

## 2023-04-09 DIAGNOSIS — F332 Major depressive disorder, recurrent severe without psychotic features: Secondary | ICD-10-CM | POA: Diagnosis not present

## 2023-04-12 DIAGNOSIS — N3946 Mixed incontinence: Secondary | ICD-10-CM | POA: Diagnosis not present

## 2023-05-07 ENCOUNTER — Encounter: Payer: Self-pay | Admitting: Oncology

## 2023-05-07 ENCOUNTER — Inpatient Hospital Stay: Payer: BC Managed Care – PPO | Attending: Oncology | Admitting: Oncology

## 2023-05-07 ENCOUNTER — Inpatient Hospital Stay: Payer: BC Managed Care – PPO

## 2023-05-07 VITALS — BP 126/73 | HR 64 | Temp 97.6°F | Ht 73.0 in | Wt 195.0 lb

## 2023-05-07 DIAGNOSIS — D509 Iron deficiency anemia, unspecified: Secondary | ICD-10-CM | POA: Diagnosis not present

## 2023-05-07 NOTE — Progress Notes (Signed)
 [facility]  [phone] Fax:([phone]  ID: Ashley Valencia OB: 16-May-1990  MR#: [id number]  QIO#:[id number]  Patient Care Team: Soundra Pilon, FNP as PCP - General (Family Medicine)  CHIEF COMPLAINT: Iron deficiency anemia.  INTERVAL HISTORY: Patient is a 33 year old female who was noted to have reduced hemoglobin and iron stores on routine blood work.  She is referred for further evaluation and consideration of IV iron.  She currently feels well and is asymptomatic.  She has no neurologic complaints.  She denies any recent fevers or illnesses.  She has a good appetite and denies weight loss.  She has no chest pain, shortness of breath, cough, or hemoptysis.  She denies any nausea, vomiting, constipation, or diarrhea.  She has no urinary complaints.  Patient offers no specific complaints today.  REVIEW OF SYSTEMS:   Review of Systems  Constitutional: Negative.  Negative for fever, malaise/fatigue and weight loss.  Respiratory: Negative.  Negative for cough, hemoptysis and shortness of breath.   Cardiovascular: Negative.  Negative for chest pain and leg swelling.  Gastrointestinal: Negative.  Negative for abdominal pain.  Genitourinary: Negative.  Negative for dysuria.  Musculoskeletal: Negative.  Negative for back pain.  Skin: Negative.  Negative for rash.  Neurological: Negative.  Negative for dizziness, focal weakness, weakness and headaches.  Psychiatric/Behavioral: Negative.  The patient is not nervous/anxious.     As per HPI. Otherwise, a complete review of systems is negative.  PAST MEDICAL HISTORY: Past Medical History:  Diagnosis Date   History of bladder problems    Migraines     PAST SURGICAL HISTORY: Past Surgical History:  Procedure Laterality Date   UMBILICAL HERNIA REPAIR     age 79    FAMILY HISTORY: Family History  Problem Relation Age of Onset   Diabetes Mother    Hypertension Mother    Kidney failure Mother     Diabetes Maternal Grandmother    Hypertension Maternal Grandmother    Kidney failure Maternal Grandmother    Diabetes Maternal Grandfather    Hypertension Maternal Grandfather     ADVANCED DIRECTIVES (Y/N):  N  HEALTH MAINTENANCE: Social History   Tobacco Use   Smoking status: Never   Smokeless tobacco: Never  Substance Use Topics   Alcohol use: Yes    Comment: rarely   Drug use: No     Colonoscopy:  PAP:  Bone density:  Lipid panel:  No Known Allergies  Current Outpatient Medications  Medication Sig Dispense Refill   EMGALITY 120 MG/ML SOAJ Inject 1 mL into the skin every 30 (thirty) days.     fluvoxaMINE (LUVOX) 50 MG tablet Take 50 mg by mouth at bedtime.     lamoTRIgine (LAMICTAL) 100 MG tablet Take 100 mg by mouth daily.     pantoprazole (PROTONIX) 40 MG tablet Take 40 mg by mouth daily.     propranolol ER (INDERAL LA) 60 MG 24 hr capsule Take 60 mg by mouth daily.     solifenacin (VESICARE) 10 MG tablet Take 10 mg by mouth daily.     SUMAtriptan (IMITREX) 50 MG tablet SMARTSIG:1 Tablet(s) By Mouth     No current facility-administered medications for this visit.    OBJECTIVE: Vitals:   05/07/23 1340  BP: 126/73  Pulse: 64  Temp: 97.6 F (36.4 C)  SpO2: 100%     Body mass index is 25.73 kg/m.    ECOG FS:0 - Asymptomatic  General: Well-developed, well-nourished, no acute distress. Eyes: Pink conjunctiva,  anicteric sclera. HEENT: Normocephalic, moist mucous membranes. Lungs: No audible wheezing or coughing. Heart: Regular rate and rhythm. Abdomen: Soft, nontender, no obvious distention. Musculoskeletal: No edema, cyanosis, or clubbing. Neuro: Alert, answering all questions appropriately. Cranial nerves grossly intact. Skin: No rashes or petechiae noted. Psych: Normal affect. Lymphatics: No cervical, calvicular, axillary or inguinal LAD.   LAB RESULTS:  Lab Results  Component Value Date   NA 139 06/03/2020   K 3.3 (L) 06/03/2020   CL 110  06/03/2020   CO2 22 06/03/2020   GLUCOSE 105 (H) 06/03/2020   BUN 6 06/03/2020   CREATININE 0.74 06/03/2020   CALCIUM 8.6 (L) 06/03/2020   PROT 5.7 (L) 06/03/2020   ALBUMIN 3.0 (L) 06/03/2020   AST 53 (H) 06/03/2020   ALT 89 (H) 06/03/2020   ALKPHOS 104 06/03/2020   BILITOT 0.7 06/03/2020   GFRNONAA >60 06/03/2020    Lab Results  Component Value Date   WBC 6.5 06/03/2020   NEUTROABS 4.1 06/03/2020   HGB 11.3 (L) 06/03/2020   HCT 34.7 (L) 06/03/2020   MCV 81.6 06/03/2020   PLT 158 06/03/2020     STUDIES: No results found.  ASSESSMENT: Iron deficiency anemia.  PLAN:    Iron deficiency anemia: Likely secondary to heavy menses.  Patient's laboratory work at outside facility revealed a hemoglobin of 10.3, ferritin of 6, iron saturation of 5%, and a total iron of 22.  B12 and folate were within normal limits.  Patient will affect from IV iron.  Return to clinic 5 times over the next 2 to 3 weeks to receive 5 doses of 200 mg IV Venofer.  Patient will then return to clinic in 4 months with repeat laboratory work, further evaluation, and continuation of treatment if needed.  I spent a total of 45 minutes reviewing chart data, face-to-face evaluation with the patient, counseling and coordination of care as detailed above.  Patient expressed understanding and was in agreement with this plan. She also understands that She can call clinic at any time with any questions, concerns, or complaints.    Jeralyn Ruths, MD   05/07/2023 1:51 PM

## 2023-05-14 ENCOUNTER — Inpatient Hospital Stay: Payer: BC Managed Care – PPO

## 2023-05-15 DIAGNOSIS — K219 Gastro-esophageal reflux disease without esophagitis: Secondary | ICD-10-CM | POA: Diagnosis not present

## 2023-05-15 DIAGNOSIS — G43101 Migraine with aura, not intractable, with status migrainosus: Secondary | ICD-10-CM | POA: Diagnosis not present

## 2023-05-16 ENCOUNTER — Inpatient Hospital Stay: Payer: BC Managed Care – PPO

## 2023-05-16 ENCOUNTER — Inpatient Hospital Stay: Attending: Oncology

## 2023-05-16 VITALS — BP 131/78 | HR 60 | Temp 96.0°F | Resp 18

## 2023-05-16 DIAGNOSIS — D509 Iron deficiency anemia, unspecified: Secondary | ICD-10-CM | POA: Diagnosis not present

## 2023-05-16 MED ORDER — SODIUM CHLORIDE 0.9% FLUSH
10.0000 mL | Freq: Once | INTRAVENOUS | Status: DC | PRN
Start: 2023-05-16 — End: 2023-05-16
  Filled 2023-05-16: qty 10

## 2023-05-16 MED ORDER — IRON SUCROSE 20 MG/ML IV SOLN
200.0000 mg | Freq: Once | INTRAVENOUS | Status: AC
  Administered 2023-05-16: 200 mg via INTRAVENOUS
  Filled 2023-05-16: qty 10

## 2023-05-16 NOTE — Patient Instructions (Signed)

## 2023-05-21 ENCOUNTER — Inpatient Hospital Stay: Payer: BC Managed Care – PPO

## 2023-05-23 ENCOUNTER — Inpatient Hospital Stay: Payer: BC Managed Care – PPO

## 2023-05-24 ENCOUNTER — Inpatient Hospital Stay: Payer: BC Managed Care – PPO

## 2023-05-24 VITALS — BP 142/80 | HR 67 | Temp 97.0°F | Resp 16

## 2023-05-24 DIAGNOSIS — D509 Iron deficiency anemia, unspecified: Secondary | ICD-10-CM | POA: Diagnosis not present

## 2023-05-24 MED ORDER — IRON SUCROSE 20 MG/ML IV SOLN
200.0000 mg | Freq: Once | INTRAVENOUS | Status: AC
  Administered 2023-05-24: 200 mg via INTRAVENOUS
  Filled 2023-05-24: qty 10

## 2023-05-24 NOTE — Patient Instructions (Signed)

## 2023-05-28 ENCOUNTER — Inpatient Hospital Stay

## 2023-05-28 ENCOUNTER — Inpatient Hospital Stay: Payer: BC Managed Care – PPO

## 2023-05-28 VITALS — BP 130/78 | HR 70 | Temp 97.5°F | Resp 16

## 2023-05-28 DIAGNOSIS — D509 Iron deficiency anemia, unspecified: Secondary | ICD-10-CM | POA: Diagnosis not present

## 2023-05-28 MED ORDER — SODIUM CHLORIDE 0.9% FLUSH
10.0000 mL | Freq: Once | INTRAVENOUS | Status: AC | PRN
  Administered 2023-05-28: 10 mL
  Filled 2023-05-28: qty 10

## 2023-05-28 MED ORDER — IRON SUCROSE 20 MG/ML IV SOLN
200.0000 mg | Freq: Once | INTRAVENOUS | Status: AC
  Administered 2023-05-28: 200 mg via INTRAVENOUS

## 2023-05-28 NOTE — Progress Notes (Signed)
 Patient declined to wait the 30 minutes for post iron infusion observation today. Tolerated infusion well. VSS.

## 2023-06-05 ENCOUNTER — Inpatient Hospital Stay: Payer: BC Managed Care – PPO

## 2023-06-05 VITALS — BP 128/72 | HR 61 | Temp 98.0°F | Resp 18

## 2023-06-05 DIAGNOSIS — D509 Iron deficiency anemia, unspecified: Secondary | ICD-10-CM | POA: Diagnosis not present

## 2023-06-05 MED ORDER — IRON SUCROSE 20 MG/ML IV SOLN
200.0000 mg | Freq: Once | INTRAVENOUS | Status: AC
  Administered 2023-06-05: 200 mg via INTRAVENOUS

## 2023-06-05 MED ORDER — SODIUM CHLORIDE 0.9% FLUSH
10.0000 mL | Freq: Once | INTRAVENOUS | Status: AC | PRN
  Administered 2023-06-05: 10 mL
  Filled 2023-06-05: qty 10

## 2023-06-14 ENCOUNTER — Inpatient Hospital Stay: Payer: BC Managed Care – PPO

## 2023-06-14 ENCOUNTER — Ambulatory Visit: Payer: Self-pay | Admitting: Podiatry

## 2023-06-15 ENCOUNTER — Inpatient Hospital Stay: Attending: Oncology

## 2023-06-15 VITALS — BP 147/90 | HR 72 | Resp 8

## 2023-06-15 DIAGNOSIS — D509 Iron deficiency anemia, unspecified: Secondary | ICD-10-CM | POA: Diagnosis not present

## 2023-06-15 MED ORDER — IRON SUCROSE 20 MG/ML IV SOLN
200.0000 mg | Freq: Once | INTRAVENOUS | Status: AC
Start: 2023-06-15 — End: 2023-06-15
  Administered 2023-06-15: 200 mg via INTRAVENOUS

## 2023-07-03 ENCOUNTER — Encounter: Payer: Self-pay | Admitting: Emergency Medicine

## 2023-07-03 ENCOUNTER — Other Ambulatory Visit: Payer: Self-pay

## 2023-07-03 ENCOUNTER — Emergency Department
Admission: EM | Admit: 2023-07-03 | Discharge: 2023-07-03 | Disposition: A | Discharge status: Home / Self Care | Attending: Emergency Medicine | Admitting: Emergency Medicine

## 2023-07-03 DIAGNOSIS — W268XXA Contact with other sharp object(s), not elsewhere classified, initial encounter: Secondary | ICD-10-CM | POA: Insufficient documentation

## 2023-07-03 DIAGNOSIS — S51812A Laceration without foreign body of left forearm, initial encounter: Secondary | ICD-10-CM | POA: Insufficient documentation

## 2023-07-03 DIAGNOSIS — S59912A Unspecified injury of left forearm, initial encounter: Secondary | ICD-10-CM | POA: Diagnosis not present

## 2023-07-03 MED ORDER — CEPHALEXIN 500 MG PO CAPS: 500.0000 mg | ORAL_CAPSULE | Freq: Three times a day (TID) | ORAL | 0 refills | Status: AC

## 2023-07-03 MED ORDER — LIDOCAINE HCL (PF) 1 % IJ SOLN
5.0000 mL | Freq: Once | INTRAMUSCULAR | Status: AC
  Administered 2023-07-03: 5 mL
  Filled 2023-07-03: qty 5

## 2023-07-03 NOTE — ED Triage Notes (Signed)
 Pt presents to the ED via POV with complaints of L forearm lac that occurred yesterday afternoon at work. She notes cutting her arm on a piece of metal. Bleeding controlled with gauze and coban. UTD on tetanus - not on thinners nor ASA. A&Ox4 at this time.

## 2023-07-03 NOTE — Discharge Instructions (Signed)
 Keep the areas clean and dry as possible. Take the antibiotic as prescribed Only wash the wound with soap and water.  Do not use hydroperoxide as it would create a wide thick wound Return emergency department for any sign of infection Sutures should be removed in 7 to 10 days

## 2023-07-03 NOTE — ED Provider Notes (Signed)
 [facility] Provider Note    Event Date/Time   First MD Initiated Contact with Patient 07/03/23 [phone]     (approximate)   History   Extremity Laceration   HPI  Ashley Valencia is a 33 y.o. female with no significant past medical history presents emergency department with laceration to the left forearm that occurred yesterday.  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  Patient does not want to file Worker's Comp.  Tdap is up-to-date      Physical Exam   Triage Vital Signs: ED Triage Vitals  Encounter Vitals Group     BP 07/03/23 0538 136/75     Systolic BP Percentile --      Diastolic BP Percentile --      Pulse Rate 07/03/23 0538 85     Resp 07/03/23 0538 18     Temp 07/03/23 0538 98 F (36.7 C)     Temp src --      SpO2 07/03/23 0538 100 %     Weight 07/03/23 0537 190 lb (86.2 kg)     Height 07/03/23 0537 6\' 1"  (1.854 m)     Head Circumference --      Peak Flow --      Pain Score 07/03/23 0537 0     Pain Loc --      Pain Education --      Exclude from Growth Chart --     Most recent vital signs: Vitals:   07/03/23 0538  BP: 136/75  Pulse: 85  Resp: 18  Temp: 98 F (36.7 C)  SpO2: 100%     General: Awake, no distress.   CV:  Good peripheral perfusion. regular rate and  rhythm Resp:  Normal effort. Abd:  No distention.   Other:  Left forearm with 4 cm linear laceration on the left forearm, no foreign body   ED Results / Procedures / Treatments   Labs (all labs ordered are listed, but only abnormal results are displayed) Labs Reviewed - No data to display   EKG     RADIOLOGY     PROCEDURES:   .Laceration Repair  Date/Time: 07/03/2023 7:54 AM  Performed by: Delsie Figures, PA-C Authorized by: Delsie Figures, PA-C   Consent:    Consent obtained:  Verbal   Consent given by:  Patient   Risks, benefits, and alternatives were discussed: yes     Risks discussed:  Infection,  pain, retained foreign body, need for additional repair, poor cosmetic result, tendon damage, vascular damage, poor wound healing and nerve damage   Alternatives discussed:  No treatment Universal protocol:    Procedure explained and questions answered to patient or proxy's satisfaction: yes     Immediately prior to procedure, a time out was called: yes     Patient identity confirmed:  Verbally with patient Anesthesia:    Anesthesia method:  Local infiltration   Local anesthetic:  Lidocaine  1% w/o epi Laceration details:    Location:  Shoulder/arm   Shoulder/arm location:  L lower arm   Length (cm):  4 Pre-procedure details:    Preparation:  Patient was prepped and draped in usual sterile fashion Exploration:    Limited defect created (wound extended): no     Hemostasis achieved with:  Direct pressure   Imaging outcome: foreign body not noted     Wound exploration: wound explored through full range of motion and entire  depth of wound visualized     Wound extent: areolar tissue not violated, fascia not violated, no foreign body, no signs of injury, no nerve damage, no tendon damage, no underlying fracture and no vascular damage     Contaminated: no   Treatment:    Area cleansed with:  Povidone-iodine and saline   Amount of cleaning:  Standard   Irrigation solution:  Sterile saline   Irrigation method:  Syringe and tap   Debridement:  None   Undermining:  None   Scar revision: no   Skin repair:    Repair method:  Sutures   Suture size:  5-0   Suture material:  Nylon   Suture technique:  Simple interrupted   Number of sutures:  7 Approximation:    Approximation:  Close Repair type:    Repair type:  Simple Post-procedure details:    Dressing:  Non-adherent dressing   Procedure completion:  Tolerated well, no immediate complications  Chief Complaint  Patient presents with   Extremity Laceration      MEDICATIONS ORDERED IN ED: Medications  lidocaine  (PF) (XYLOCAINE ) 1 %  injection 5 mL (has no administration in time range)     IMPRESSION / MDM / ASSESSMENT AND PLAN / ED COURSE  I reviewed the triage vital signs and the nursing notes.                              Differential diagnosis includes, but is not limited to, laceration, abrasion, wound care  Patient's presentation is most consistent with acute complicated illness / injury requiring diagnostic workup.   See procedure note for laceration repair  Patient was given wound care instructions.  Keflex  as the areas been open for greater than 12 almost 20 hours.  She is to follow-up with her regular doctor for suture removal.  Return if any sign of infection.  She is in agreement treatment plan.  Discharged stable condition.      FINAL CLINICAL IMPRESSION(S) / ED DIAGNOSES   Final diagnoses:  Forearm laceration, left, initial encounter     Rx / DC Orders   ED Discharge Orders          Ordered    cephALEXin  (KEFLEX ) 500 MG capsule  3 times daily        07/03/23 0751             Note:  This document was prepared using Dragon voice recognition software and may include unintentional dictation errors.    Delsie Figures, PA-C 07/03/23 0756    Bradler, Evan K, MD 07/05/23 [phone]

## 2023-08-28 DIAGNOSIS — Z3202 Encounter for pregnancy test, result negative: Secondary | ICD-10-CM | POA: Diagnosis not present

## 2023-08-28 DIAGNOSIS — Z30017 Encounter for initial prescription of implantable subdermal contraceptive: Secondary | ICD-10-CM | POA: Diagnosis not present

## 2023-09-03 ENCOUNTER — Other Ambulatory Visit: Payer: BC Managed Care – PPO

## 2023-09-04 ENCOUNTER — Ambulatory Visit: Payer: BC Managed Care – PPO | Admitting: Oncology

## 2023-09-04 ENCOUNTER — Ambulatory Visit: Payer: BC Managed Care – PPO

## 2023-09-06 ENCOUNTER — Inpatient Hospital Stay: Payer: BC Managed Care – PPO

## 2023-09-07 ENCOUNTER — Inpatient Hospital Stay: Payer: BC Managed Care – PPO

## 2023-09-07 ENCOUNTER — Inpatient Hospital Stay: Payer: BC Managed Care – PPO | Admitting: Oncology

## 2023-09-10 DIAGNOSIS — Z01419 Encounter for gynecological examination (general) (routine) without abnormal findings: Secondary | ICD-10-CM | POA: Diagnosis not present

## 2023-09-10 DIAGNOSIS — Z113 Encounter for screening for infections with a predominantly sexual mode of transmission: Secondary | ICD-10-CM | POA: Diagnosis not present

## 2023-09-10 DIAGNOSIS — Z124 Encounter for screening for malignant neoplasm of cervix: Secondary | ICD-10-CM | POA: Diagnosis not present

## 2023-10-09 DIAGNOSIS — G43101 Migraine with aura, not intractable, with status migrainosus: Secondary | ICD-10-CM | POA: Diagnosis not present

## 2023-10-09 DIAGNOSIS — K219 Gastro-esophageal reflux disease without esophagitis: Secondary | ICD-10-CM | POA: Diagnosis not present

## 2023-10-09 DIAGNOSIS — F3341 Major depressive disorder, recurrent, in partial remission: Secondary | ICD-10-CM | POA: Diagnosis not present

## 2023-10-09 DIAGNOSIS — F900 Attention-deficit hyperactivity disorder, predominantly inattentive type: Secondary | ICD-10-CM | POA: Diagnosis not present

## 2023-10-19 DIAGNOSIS — M25511 Pain in right shoulder: Secondary | ICD-10-CM | POA: Diagnosis not present

## 2023-11-23 ENCOUNTER — Other Ambulatory Visit: Payer: Self-pay | Admitting: Medical Genetics

## 2023-11-29 ENCOUNTER — Other Ambulatory Visit

## 2024-03-05 DIAGNOSIS — F411 Generalized anxiety disorder: Secondary | ICD-10-CM | POA: Diagnosis not present

## 2024-03-05 DIAGNOSIS — F331 Major depressive disorder, recurrent, moderate: Secondary | ICD-10-CM | POA: Diagnosis not present

## 2024-04-01 ENCOUNTER — Inpatient Hospital Stay: Admitting: Oncology

## 2024-04-01 ENCOUNTER — Inpatient Hospital Stay

## 2024-04-03 ENCOUNTER — Inpatient Hospital Stay

## 2024-04-03 ENCOUNTER — Inpatient Hospital Stay: Attending: Oncology | Admitting: Oncology

## 2024-04-03 ENCOUNTER — Encounter: Payer: Self-pay | Admitting: Oncology
